# Patient Record
Sex: Female | Born: 1960
Health system: Southern US, Community
[De-identification: ages and names within clinical notes are randomized; demographics above are authoritative.]

## PROBLEM LIST (undated history)

## (undated) DIAGNOSIS — F329 Major depressive disorder, single episode, unspecified: Secondary | ICD-10-CM

## (undated) DIAGNOSIS — F32A Depression, unspecified: Secondary | ICD-10-CM

## (undated) DIAGNOSIS — R519 Headache, unspecified: Secondary | ICD-10-CM

## (undated) DIAGNOSIS — M549 Dorsalgia, unspecified: Secondary | ICD-10-CM

## (undated) DIAGNOSIS — E785 Hyperlipidemia, unspecified: Secondary | ICD-10-CM

## (undated) DIAGNOSIS — R51 Headache: Secondary | ICD-10-CM

## (undated) DIAGNOSIS — M542 Cervicalgia: Secondary | ICD-10-CM

## (undated) DIAGNOSIS — B019 Varicella without complication: Secondary | ICD-10-CM

## (undated) HISTORY — DX: Varicella without complication: B01.9

## (undated) HISTORY — DX: Major depressive disorder, single episode, unspecified: F32.9

## (undated) HISTORY — DX: Hyperlipidemia, unspecified: E78.5

## (undated) HISTORY — DX: Cervicalgia: M54.2

## (undated) HISTORY — DX: Headache, unspecified: R51.9

## (undated) HISTORY — DX: Depression, unspecified: F32.A

## (undated) HISTORY — PX: TONSILLECTOMY: SUR1361

## (undated) HISTORY — DX: Dorsalgia, unspecified: M54.9

## (undated) HISTORY — PX: TUBAL LIGATION: SHX77

## (undated) HISTORY — DX: Headache: R51

---

## 2009-12-17 HISTORY — PX: COLONOSCOPY: SHX174

## 2014-05-03 ENCOUNTER — Encounter: Payer: Self-pay | Admitting: Internal Medicine

## 2014-05-03 ENCOUNTER — Ambulatory Visit (INDEPENDENT_AMBULATORY_CARE_PROVIDER_SITE_OTHER)
Admission: RE | Admit: 2014-05-03 | Discharge: 2014-05-03 | Disposition: A | Discharge status: Home / Self Care | Payer: BLUE CROSS/BLUE SHIELD | Source: Ambulatory Visit | Attending: Internal Medicine | Admitting: Internal Medicine

## 2014-05-03 ENCOUNTER — Ambulatory Visit (INDEPENDENT_AMBULATORY_CARE_PROVIDER_SITE_OTHER): Payer: BLUE CROSS/BLUE SHIELD | Admitting: Internal Medicine

## 2014-05-03 ENCOUNTER — Encounter (INDEPENDENT_AMBULATORY_CARE_PROVIDER_SITE_OTHER): Payer: Self-pay

## 2014-05-03 VITALS — BP 116/84 | HR 60 | Temp 98.3°F | Ht 61.0 in | Wt 131.0 lb

## 2014-05-03 DIAGNOSIS — F329 Major depressive disorder, single episode, unspecified: Secondary | ICD-10-CM | POA: Insufficient documentation

## 2014-05-03 DIAGNOSIS — M545 Low back pain, unspecified: Secondary | ICD-10-CM

## 2014-05-03 DIAGNOSIS — F32A Depression, unspecified: Secondary | ICD-10-CM

## 2014-05-03 DIAGNOSIS — R51 Headache: Secondary | ICD-10-CM

## 2014-05-03 DIAGNOSIS — N941 Dyspareunia: Secondary | ICD-10-CM

## 2014-05-03 DIAGNOSIS — R519 Headache, unspecified: Secondary | ICD-10-CM

## 2014-05-03 DIAGNOSIS — IMO0002 Reserved for concepts with insufficient information to code with codable children: Secondary | ICD-10-CM | POA: Insufficient documentation

## 2014-05-03 DIAGNOSIS — M546 Pain in thoracic spine: Secondary | ICD-10-CM | POA: Diagnosis not present

## 2014-05-03 NOTE — Progress Notes (Signed)
Pre visit review using our clinic review tool, if applicable. No additional management support is needed unless otherwise documented below in the visit note. 

## 2014-05-03 NOTE — Assessment & Plan Note (Signed)
She is holding her Osphena right now as she is currently sexually active Advised her she can restart this at any time

## 2014-05-03 NOTE — Progress Notes (Signed)
HPI  Pt presents to the clinic today to establish care and for management of the conditions listed below. She moved from [place] 10/2013. She is transferring care from Dr. Garrison Columbus at [facility].  Depression: This is a chronic issue for her. She been on prozac for 20+ years. She has tried and failed multiple other medications in the past including wellbutrin, lexapro and cympbalta. She is not suicidal but she does think life would be better if she "did not exist". She has seen a therapist in the past but did not feel that it was very helpful. She has Ativan if she ever needs it but reports she has never taken it.  Frequent Headaches: She has chronic headaches that have lead to migraines in the past. She has not had any in the last 3 months. She has a RX for relpax for the treatment of migraines if she needs it.  She also c/o mid and low back pain. The mid back pain started 3 months ago. It is intermittent. The low back pain she has had for a number of years. She reports the pain feels achy and stiff. It does not radiate. She denies numbness and tingling or problems with her bowel or bladder. The pain is worse with certain movements. She denies injury to the area. She takes Aleve as needed for pain.  Dyspareunia: She was on Osphena but reports she stopped it recently because she is not currently sexually active.  Flu: 2014 Tetanus: unsure but knows it was before 2008 Mammogram: 2011 Pap Smear: 05/2013 Colon Screening: 2011 (10 years) Vision Screening: as needed Dentist: as needed  Past Medical History  Diagnosis Date  . Chicken pox   . Depression   . Frequent headaches     Current Outpatient Prescriptions  Medication Sig Dispense Refill  . eletriptan (RELPAX) 40 MG tablet Take 40 mg by mouth as needed for migraine or headache. May repeat in 2 hours if headache persists or recurs.    Marland Kitchen FLUoxetine (PROZAC) 40 MG capsule Take 1 capsule by mouth daily.    Marland Kitchen FOLIC ACID-VIT  U1-LKG B12 PO Take 1 packet by mouth daily.    Marland Kitchen LORazepam (ATIVAN) 0.5 MG tablet Take 1 tablet by mouth at bedtime as needed.    . Ospemifene 60 MG TABS Take 1 tablet by mouth daily. For vaginal dryness     No current facility-administered medications for this visit.    No Known Allergies  Family History  Problem Relation Age of Onset  . Arthritis Mother   . Cancer Mother     Lung  . Alzheimer's disease Mother   . Cancer Father     prostate  . Hypertension Father   . Diabetes Father   . Hypertension Sister   . Diabetes Sister   . Arthritis Maternal Aunt   . Arthritis Maternal Grandmother   . Bipolar disorder Sister     History   Social History  . Marital Status: Married    Spouse Name: N/A  . Number of Children: N/A  . Years of Education: N/A   Occupational History  . Not on file.   Social History Main Topics  . Smoking status: Never Smoker   . Smokeless tobacco: Never Used  . Alcohol Use: 0.0 oz/week    0 Standard drinks or equivalent per week     Comment: occasional  . Drug Use: Not on file  . Sexual Activity: Not on file   Other Topics Concern  .  Not on file   Social History Narrative  . No narrative on file    ROS:  Constitutional: Denies fever, malaise, fatigue, headache or abrupt weight changes.  Respiratory: Denies difficulty breathing, shortness of breath, cough or sputum production.   Cardiovascular: Denies chest pain, chest tightness, palpitations or swelling in the hands or feet.  Musculoskeletal: Pt reports thoracic and lumbar back pain. Denies difficulty with gait, muscle pain or joint pain and swelling.  Skin: Denies redness, rashes, lesions or ulcercations.  Neurological: Denies dizziness, difficulty with memory, difficulty with speech or problems with balance and coordination.  Psych: Pt reports depression. Denies anxiety, SI/HI.  No other specific complaints in a complete review of systems (except as listed in HPI  above).  PE:  There were no vitals taken for this visit. Wt Readings from Last 3 Encounters:  No data found for Wt    General: Appears her stated age, well developed, well nourished in NAD. Cardiovascular: Normal rate and rhythm. S1,S2 noted.  No murmur, rubs or gallops noted.  Pulmonary/Chest: Normal effort and positive vesicular breath sounds. No respiratory distress. No wheezes, rales or ronchi noted.  Musculoskeletal: Decreased flexion of spine but normal extension and rotation. Pinpoint tenderness over thoracic and lumbar spine. No pain with palpation of the paraspinal muscles. Strength 5/5 BUE/BLE.  No difficulty with gait.  Neurological: Alert and oriented. Psychiatric: Mood and affect flat. Behavior is normal. Judgment and thought content normal.    Assessment and Plan:  Thoracic/Lumbar back pain:  Continue Aleve as needed Will obtain xray of thoracic and lumbar spine today  Advised her to make an appt for her annual exam

## 2014-05-03 NOTE — Patient Instructions (Signed)

## 2014-05-03 NOTE — Assessment & Plan Note (Signed)
Chronic but stable on prozac She is not interested in referral to psychotherapy at this time Support offered today

## 2014-05-03 NOTE — Assessment & Plan Note (Signed)
Chronic but stable No issues in the last 3 months She has relpax if needed Will monitor for now

## 2014-06-12 ENCOUNTER — Ambulatory Visit (INDEPENDENT_AMBULATORY_CARE_PROVIDER_SITE_OTHER): Payer: BLUE CROSS/BLUE SHIELD | Admitting: Internal Medicine

## 2014-06-12 ENCOUNTER — Ambulatory Visit: Payer: BLUE CROSS/BLUE SHIELD | Admitting: Internal Medicine

## 2014-06-12 ENCOUNTER — Encounter: Payer: Self-pay | Admitting: Internal Medicine

## 2014-06-12 VITALS — BP 120/82 | HR 60 | Temp 98.1°F | Ht 61.0 in | Wt 134.0 lb

## 2014-06-12 DIAGNOSIS — Z0000 Encounter for general adult medical examination without abnormal findings: Secondary | ICD-10-CM | POA: Diagnosis not present

## 2014-06-12 DIAGNOSIS — F458 Other somatoform disorders: Secondary | ICD-10-CM

## 2014-06-12 DIAGNOSIS — M545 Low back pain, unspecified: Secondary | ICD-10-CM

## 2014-06-12 DIAGNOSIS — M546 Pain in thoracic spine: Secondary | ICD-10-CM

## 2014-06-12 DIAGNOSIS — Z1239 Encounter for other screening for malignant neoplasm of breast: Secondary | ICD-10-CM

## 2014-06-12 DIAGNOSIS — R0989 Other specified symptoms and signs involving the circulatory and respiratory systems: Secondary | ICD-10-CM

## 2014-06-12 DIAGNOSIS — Z13820 Encounter for screening for osteoporosis: Secondary | ICD-10-CM

## 2014-06-12 LAB — VITAMIN D 25 HYDROXY (VIT D DEFICIENCY, FRACTURES): VITD: 16.43 ng/mL — ABNORMAL LOW (ref 30.00–100.00)

## 2014-06-12 LAB — CBC
HCT: 38.2 % (ref 36.0–46.0)
HEMOGLOBIN: 13 g/dL (ref 12.0–15.0)
MCHC: 33.9 g/dL (ref 30.0–36.0)
MCV: 87.4 fl (ref 78.0–100.0)
PLATELETS: 345 10*3/uL (ref 150.0–400.0)
RBC: 4.37 Mil/uL (ref 3.87–5.11)
RDW: 13.3 % (ref 11.5–15.5)
WBC: 5.8 10*3/uL (ref 4.0–10.5)

## 2014-06-12 LAB — COMPREHENSIVE METABOLIC PANEL
ALBUMIN: 4.2 g/dL (ref 3.5–5.2)
ALT: 35 U/L (ref 0–35)
AST: 24 U/L (ref 0–37)
Alkaline Phosphatase: 80 U/L (ref 39–117)
BILIRUBIN TOTAL: 0.3 mg/dL (ref 0.2–1.2)
BUN: 14 mg/dL (ref 6–23)
CALCIUM: 9.4 mg/dL (ref 8.4–10.5)
CO2: 30 mEq/L (ref 19–32)
Chloride: 102 mEq/L (ref 96–112)
Creatinine, Ser: 0.69 mg/dL (ref 0.40–1.20)
GFR: 93.78 mL/min (ref >60.00)
Glucose, Bld: 95 mg/dL (ref 70–99)
Potassium: 4.7 mEq/L (ref 3.5–5.1)
SODIUM: 136 meq/L (ref 135–145)
TOTAL PROTEIN: 7.1 g/dL (ref 6.0–8.3)

## 2014-06-12 LAB — LIPID PANEL
CHOL/HDL RATIO: 4
Cholesterol: 199 mg/dL (ref 0–200)
HDL: 48.2 mg/dL (ref >39.00)
LDL Cholesterol: 124 mg/dL — ABNORMAL HIGH (ref 0–99)
NONHDL: 150.8
TRIGLYCERIDES: 135 mg/dL (ref 0.0–149.0)
VLDL: 27 mg/dL (ref 0.0–40.0)

## 2014-06-12 LAB — TSH: TSH: 2.38 u[IU]/mL (ref 0.35–4.50)

## 2014-06-12 NOTE — Addendum Note (Signed)
Addended by: Marchia Bond on: 06/12/2014 03:54 PM   Modules accepted: Miquel Dunn

## 2014-06-12 NOTE — Addendum Note (Signed)
Addended by: Jearld Fenton on: 06/12/2014 12:59 PM   Modules accepted: Level of Service, SmartSet

## 2014-06-12 NOTE — Patient Instructions (Signed)

## 2014-06-12 NOTE — Progress Notes (Addendum)
Subjective:    Patient ID: Angelica Knight, female    DOB: 1960/02/25, 54 y.o.   MRN: [id number]  HPI  Pt presents to the clinic today for her annual exam.  Flu: 2014 Tetanus: She is unsure but knows it was before 2008 Pap Smear: 05/2013 Mammogram: 2011 Colon Screening: 2011 Vision Screening: as needed Dentist: annually  She is also concerned about a sensation that something is stuck in her throat.This has been going for 2 months now. It is constant. She feels like she has trouble swallowing but food does not get stuck in her throat. She denies post nasal drip, cough or shortness of breath. She has not really tried anything OTC to make it better. She denies reflux symptoms. She has been seen for the same symptoms about 20 years ago. She did have an upper endoscopy which was normal. Many years ago, they did advise her to start taking a PPI to see if her symptoms improved, but she reports she never took it because she did not feel like her symptoms were consistent with reflux. She would like further evaluation today.  She also wants to follow up her mid/lower back pain. She had an xray of the thoracic spine which showed: FINDINGS: There is conventional anatomy with 12 rib-bearing thoracic type vertebral bodies. There is a mild convex right scoliosis centered at T10. The lateral alignment is normal. The thoracic disc spaces are largely preserved. There is mild disc space loss and uncinate spurring at C6-7. No acute osseous findings or paraspinal abnormalities.  Xray of the lumbar spine showed: FINDINGS: There are 5 lumbar type vertebral bodies demonstrating a mild convex left scoliosis centered at L2. The lateral alignment is normal aside from minimal anterolisthesis at L4-5. The disc spaces are preserved. There is moderate facet disease inferiorly. No evidence of acute fracture or pars defect. Bilateral pelvic calcifications are likely Phleboliths.  She reports this back pain  interferes with her ADL's. She is not able to exercise like she would like. She has difficulty with housework, etc. She does take Aleve prn which does seem to provide some relief but she does not take it daily. She would like to know if further evaluation is needed by an orthopedist.  Review of Systems      Past Medical History  Diagnosis Date  . Chicken pox   . Depression   . Frequent headaches     Current Outpatient Prescriptions  Medication Sig Dispense Refill  . eletriptan (RELPAX) 40 MG tablet Take 40 mg by mouth as needed for migraine or headache. May repeat in 2 hours if headache persists or recurs.    Marland Kitchen FLUoxetine (PROZAC) 40 MG capsule Take 1 capsule by mouth daily.    Marland Kitchen FOLIC ACID-VIT O0-BTD H74 PO Take 1 packet by mouth daily.    Marland Kitchen LORazepam (ATIVAN) 0.5 MG tablet Take 1 tablet by mouth at bedtime as needed.    . Ospemifene 60 MG TABS Take 1 tablet by mouth daily. For vaginal dryness     No current facility-administered medications for this visit.    No Known Allergies  Family History  Problem Relation Age of Onset  . Arthritis Mother   . Cancer Mother     Lung  . Alzheimer's disease Mother   . Cancer Father     prostate  . Hypertension Father   . Diabetes Father   . Hypertension Sister   . Diabetes Sister   . Arthritis Maternal Aunt   .  Arthritis Maternal Grandmother   . Bipolar disorder Sister     History   Social History  . Marital Status: Married    Spouse Name: N/A  . Number of Children: N/A  . Years of Education: N/A   Occupational History  . Not on file.   Social History Main Topics  . Smoking status: Never Smoker   . Smokeless tobacco: Never Used  . Alcohol Use: 0.0 oz/week    0 Standard drinks or equivalent per week     Comment: occasional  . Drug Use: No  . Sexual Activity: Yes   Other Topics Concern  . Not on file   Social History Narrative     Constitutional: Pt reports fatigue. Denies fever, malaise, headache or abrupt  weight changes.  HEENT: Pt reports globus sensation. Denies eye pain, eye redness, ear pain, ringing in the ears, wax buildup, runny nose, nasal congestion, bloody nose, or sore throat. Respiratory: Denies difficulty breathing, shortness of breath, cough or sputum production.   Cardiovascular: Denies chest pain, chest tightness, palpitations or swelling in the hands or feet.  Gastrointestinal: Denies abdominal pain, bloating, constipation, diarrhea or blood in the stool.  GU: Denies urgency, frequency, pain with urination, burning sensation, blood in urine, odor or discharge. Musculoskeletal: Pt reports back pain. Denies decrease in range of motion, difficulty with gait, muscle pain or joint pain and swelling.  Skin: Denies redness, rashes, lesions or ulcercations.  Neurological: Denies dizziness, difficulty with memory, difficulty with speech or problems with balance and coordination.  Psych: Denies anxiety, depression, SI/HI.  No other specific complaints in a complete review of systems (except as listed in HPI above).  Objective:   Physical Exam   BP 120/82 mmHg  Pulse 60  Temp(Src) 98.1 F (36.7 C) (Oral)  Ht 5\' 1"  (1.549 m)  Wt 134 lb (60.782 kg)  BMI 25.33 kg/m2  SpO2 98% Wt Readings from Last 3 Encounters:  06/12/14 134 lb (60.782 kg)  05/03/14 131 lb (59.421 kg)    General: Appears her stated age, well developed, well nourished in NAD. Skin: Warm, dry and intact. No rashes, lesions or ulcerations noted. HEENT: Head: normal shape and size; Eyes: sclera white, no icterus, conjunctiva pink, PERRLA and EOMs intact; Ears: Tm's gray and intact, normal light reflex; Throat/Mouth: Teeth present, mucosa pink and moist, no exudate, lesions or ulcerations noted.  Neck: Neck supple, trachea midline. No masses, lumps or thyromegaly present.  Cardiovascular: Normal rate and rhythm. S1,S2 noted.  No murmur, rubs or gallops noted. No JVD or BLE edema. No carotid bruits  noted. Pulmonary/Chest: Normal effort and positive vesicular breath sounds. No respiratory distress. No wheezes, rales or ronchi noted.  Abdomen: Soft and nontender. Normal bowel sounds, no bruits noted. No distention or masses noted. Liver, spleen and kidneys non palpable. Musculoskeletal: Normal flexion, extension and rotation of the spine. She does have some pinpoint tenderness over the lumbar spine. Strength 5/5 BUE/BLE. No difficulty with gait.  Neurological: Alert and oriented. Cranial nerves II-XII grossly intact. Coordination normal.  Psychiatric: Mood anxious but affect normal. Behavior is normal. Judgment and thought content normal.        Assessment & Plan:   Preventative Health Maintenance:  She will consider getting a flu shot in the fall Tetanus not yet due Will order her mammogram for [place], she will call to schedule Pap smear due 05/2018 Encouraged her to make an appt for an eye exam Continue to see a dentist yearly Will check CBC, CMET,  Lipid and Vit D levels today  Thoracic/lumbar back pain:  Advised her to try strengthening her core  Start taking Aleve daily If symptoms persist, will refer to ortho  Globus sensation:  Will check TSH Advised her that this could be GERD, but she still does not want to try PPI Will refer to GI for further evaluation  RTC in 6 months to follow up chronic conditions, sooner if needed Will order bone density for [facility], someone will call you to set this up

## 2014-06-12 NOTE — Progress Notes (Signed)
Pre visit review using our clinic review tool, if applicable. No additional management support is needed unless otherwise documented below in the visit note. 

## 2014-06-13 MED ORDER — VITAMIN D (ERGOCALCIFEROL) 1.25 MG (50000 UNIT) PO CAPS: 50000.0000 [IU] | ORAL_CAPSULE | ORAL | Status: DC

## 2014-06-13 NOTE — Addendum Note (Signed)
Addended by: Lurlean Nanny on: 06/13/2014 02:41 PM   Modules accepted: Orders

## 2014-06-25 ENCOUNTER — Telehealth: Payer: Self-pay | Admitting: *Deleted

## 2014-06-25 NOTE — Telephone Encounter (Signed)
Mammogram is scheduled 07/02/14.

## 2014-06-27 ENCOUNTER — Encounter: Payer: BLUE CROSS/BLUE SHIELD | Admitting: Internal Medicine

## 2014-06-27 ENCOUNTER — Ambulatory Visit: Payer: BLUE CROSS/BLUE SHIELD | Admitting: Internal Medicine

## 2014-07-02 ENCOUNTER — Ambulatory Visit
Admission: RE | Admit: 2014-07-02 | Discharge: 2014-07-02 | Disposition: A | Discharge status: Home / Self Care | Payer: BLUE CROSS/BLUE SHIELD | Source: Ambulatory Visit | Attending: Internal Medicine | Admitting: Internal Medicine

## 2014-07-02 DIAGNOSIS — Z801 Family history of malignant neoplasm of trachea, bronchus and lung: Secondary | ICD-10-CM | POA: Insufficient documentation

## 2014-07-02 DIAGNOSIS — Z13820 Encounter for screening for osteoporosis: Secondary | ICD-10-CM | POA: Diagnosis present

## 2014-07-02 DIAGNOSIS — M8580 Other specified disorders of bone density and structure, unspecified site: Secondary | ICD-10-CM | POA: Insufficient documentation

## 2014-07-02 DIAGNOSIS — Z1239 Encounter for other screening for malignant neoplasm of breast: Secondary | ICD-10-CM

## 2014-07-02 DIAGNOSIS — Z8042 Family history of malignant neoplasm of prostate: Secondary | ICD-10-CM | POA: Insufficient documentation

## 2014-07-02 DIAGNOSIS — Z1231 Encounter for screening mammogram for malignant neoplasm of breast: Secondary | ICD-10-CM | POA: Insufficient documentation

## 2014-08-03 ENCOUNTER — Other Ambulatory Visit: Payer: Self-pay

## 2014-08-03 MED ORDER — FLUOXETINE HCL 40 MG PO CAPS: 40.0000 mg | ORAL_CAPSULE | Freq: Every day | ORAL | Status: DC

## 2014-08-03 NOTE — Telephone Encounter (Signed)
Sent electronically 

## 2014-08-03 NOTE — Telephone Encounter (Signed)
This has not been filled by you-- please advise if okay to refill

## 2014-08-06 MED ORDER — FLUOXETINE HCL 40 MG PO CAPS: 40.0000 mg | ORAL_CAPSULE | Freq: Every day | ORAL | Status: DC

## 2014-08-06 NOTE — Telephone Encounter (Signed)
Pt checking status of refill fluoxetine to [facility]; was sent on 08/03/14 to [place]. Spoke with sylvia at [employer] and she d/c order. rx sent electronically to [facility] as requested. Pt voiced understanding.

## 2014-08-06 NOTE — Addendum Note (Signed)
Addended by: Helene Shoe on: 08/06/2014 02:58 PM   Modules accepted: Orders

## 2014-10-16 ENCOUNTER — Telehealth: Payer: Self-pay | Admitting: Internal Medicine

## 2014-10-16 ENCOUNTER — Other Ambulatory Visit: Payer: Self-pay | Admitting: Internal Medicine

## 2014-10-16 DIAGNOSIS — M545 Low back pain, unspecified: Secondary | ICD-10-CM

## 2014-10-16 DIAGNOSIS — M546 Pain in thoracic spine: Secondary | ICD-10-CM

## 2014-10-16 NOTE — Telephone Encounter (Signed)
Pt called back she would like to get a referral for her back and neck pain She has been going to chiropractor this is not helping

## 2014-10-16 NOTE — Telephone Encounter (Signed)
Referral to ortho placed.

## 2015-04-12 ENCOUNTER — Other Ambulatory Visit: Payer: Self-pay

## 2015-04-12 MED ORDER — FLUOXETINE HCL 40 MG PO CAPS: 40.0000 mg | ORAL_CAPSULE | Freq: Every day | ORAL | Status: DC

## 2015-05-28 ENCOUNTER — Other Ambulatory Visit: Payer: Self-pay | Admitting: Internal Medicine

## 2015-07-04 DIAGNOSIS — N760 Acute vaginitis: Secondary | ICD-10-CM | POA: Diagnosis not present

## 2015-07-11 ENCOUNTER — Ambulatory Visit (INDEPENDENT_AMBULATORY_CARE_PROVIDER_SITE_OTHER): Payer: BLUE CROSS/BLUE SHIELD | Admitting: Family Medicine

## 2015-07-11 ENCOUNTER — Encounter: Payer: Self-pay | Admitting: Family Medicine

## 2015-07-11 VITALS — BP 131/60 | HR 72 | Temp 98.2°F | Ht 61.0 in | Wt 136.9 lb

## 2015-07-11 DIAGNOSIS — R519 Headache, unspecified: Secondary | ICD-10-CM

## 2015-07-11 DIAGNOSIS — R51 Headache: Secondary | ICD-10-CM

## 2015-07-11 DIAGNOSIS — R059 Cough, unspecified: Secondary | ICD-10-CM

## 2015-07-11 DIAGNOSIS — J3089 Other allergic rhinitis: Secondary | ICD-10-CM

## 2015-07-11 DIAGNOSIS — R05 Cough: Secondary | ICD-10-CM

## 2015-07-11 DIAGNOSIS — J019 Acute sinusitis, unspecified: Secondary | ICD-10-CM

## 2015-07-11 DIAGNOSIS — F32A Depression, unspecified: Secondary | ICD-10-CM

## 2015-07-11 DIAGNOSIS — Z23 Encounter for immunization: Secondary | ICD-10-CM

## 2015-07-11 DIAGNOSIS — F329 Major depressive disorder, single episode, unspecified: Secondary | ICD-10-CM

## 2015-07-11 MED ORDER — HYDROCOD POLST-CPM POLST ER 10-8 MG/5ML PO SUER: 5.0000 mL | Freq: Two times a day (BID) | ORAL | Status: DC | PRN

## 2015-07-11 NOTE — Progress Notes (Signed)
Angelica Knight, D.O. Primary care at [facility]  Subjective:    CC: New pt, here to establish care.   HPI: Angelica Knight is a pleasant 55 y.o. female who presents to [place] at [facility] today to establish.    Moved from [place] 2 yrs ago.  Married.   Ortho Doc Basil Dess, MD) - seen recently in Fall '16 for neck pain- OA in neck gave her meloxicam- stopped takin git. Alleve knocks it out.  Hasn't been bothering her lately.   H/O migraines: None for long time. Relpax- only prn migraines but never had to use it yet.    Mood: prozac since 1994, tried several others and this has worked the best.  Felt depressed after her third child.  Deals with depression.    Sister who is bi-polar.    Lorazepam- prn for a n increase in acute stress levels.   Never uses it.   CC: one mo ago severe ST- went to an UC, prednisone and lidocaine gargle; got W, then went to ENT doc- given her ABX, got better, then 2 wks later- worse again. ABout one week sx, no b/l facial pain, bad PND ande constatnt clearing of throat keeping her up at night, HA, No F/C, b/l ear pain. No sob.  + dry cough  Getting two teeth pulled next Wednesday-   PCP- Prescott Gum at [facility].   Past Medical History  Diagnosis Date  . Chicken pox   . Depression   . Frequent headaches   . Back pain   . Neck pain     Past Surgical History  Procedure Laterality Date  . Cesarean section    . Tubal ligation    . Tonsillectomy      Family History  Problem Relation Age of Onset  . Arthritis Mother   . Cancer Mother     Lung  . Alzheimer's disease Mother   . Cancer Father     prostate  . Hypertension Father   . Diabetes Father   . Hypertension Sister   . Diabetes Sister   . Arthritis Maternal Aunt   . Arthritis Maternal Grandmother   . Bipolar disorder Sister     History  Drug Use No  ,  History  Alcohol Use  . 0.0 oz/week  . 0 Standard drinks or equivalent per week     Comment: occasional  ,  History  Smoking status  . Never Smoker   Smokeless tobacco  . Never Used  ,  History  Sexual Activity  . Sexual Activity: Not Currently    Patient's Medications  New Prescriptions   No medications on file  Previous Medications   ELETRIPTAN (RELPAX) 40 MG TABLET    Take 40 mg by mouth as needed for migraine or headache. May repeat in 2 hours if headache persists or recurs.   FLUCONAZOLE (DIFLUCAN) 150 MG TABLET    Take 150 mg by mouth every other day.   FLUOXETINE (PROZAC) 40 MG CAPSULE    Take 1 capsule (40 mg total) by mouth daily. MUST SCHEDULE ANNUAL PHYSICAL FOR June 2017   METRONIDAZOLE (METROGEL) 0.75 % VAGINAL GEL    Place 1 Applicatorful vaginally at bedtime.  Modified Medications   No medications on file  Discontinued Medications   FOLIC ACID-VIT Q000111Q [id number] PO    Take 1 packet by mouth daily.   LORAZEPAM (ATIVAN) 0.5 MG TABLET    Take  1 tablet by mouth at bedtime as needed.   OSPEMIFENE 60 MG TABS    Take 1 tablet by mouth daily. Reported on 07/11/2015   VITAMIN D, ERGOCALCIFEROL, (DRISDOL) 50000 UNITS CAPS CAPSULE    Take 1 capsule (50,000 Units total) by mouth every 7 (seven) days.    ALLERGIES: Review of patient's allergies indicates no known allergies.  Review of Systems:   ( Completed via her Adult Medical History Intake form today ) General:   Denies fever, chills, appetite changes, unexplained weight loss.  Optho/Auditory:   Denies visual changes, blurred vision/LOV, ringing in ears/ diff hearing Respiratory:   Denies SOB, DOE, cough, wheezing.  Cardiovascular:   Denies chest pain, palpitations, new onset peripheral edema  Gastrointestinal:   Denies nausea, vomiting, diarrhea.  Genitourinary:    Denies dysuria, increased frequency, flank pain.  Endocrine:     Denies hot or cold intolerance, polyuria, polydipsia. Musculoskeletal:  Denies unexplained myalgias, joint swelling, arthralgias, gait problems.  Skin:  Denies rash,  suspicious lesions or new/ changes in moles Neurological:    Denies dizziness, syncope, unexplained weakness, lightheadedness, numbness  Psychiatric/Behavioral:   Denies mood changes, suicidal or homicidal ideations, hallucinations    Objective:   Blood pressure 131/60, pulse 72, temperature 98.2 F (36.8 C), temperature source Other (Comment), height 5\' 1"  (1.549 m), weight 136 lb 14.4 oz (62.097 kg), SpO2 98 %. Body mass index is 25.88 kg/(m^2).  General: Well Developed, well nourished, and in no acute distress.  Neuro: Alert and oriented x3, extra-ocular muscles intact, sensation grossly intact.  HEENT: Normocephalic, atraumatic, pupils equal round reactive to light, conjunct clr, TM's clr b/l, NO LAD, OP- erythema mild, neck supple, no gross masses, no carotid bruits, no JVD apprec Skin: no gross suspicious lesions or rashes  Cardiac: Regular rate and rhythm, no murmurs rubs or gallops.  Respiratory: Essentially clear to auscultation bilaterally. Not using accessory muscles, speaking in full sentences.  Abdominal: Soft, not grossly distended Musculoskeletal: Ambulates w/o diff, FROM * 4 ext.  Vasc: less 2 sec cap RF, warm and pink  Psych:  No HI/SI, judgement and insight good.    Impression and Recommendations:    Do NS rinses with distilled water twice daily and as needed. Use OTC flonase as needed If dev any F/C and one sided face pain- call for advice; also f/up with your dentist.   PND causing cough;  Only use meds prn.  R/B d/c pt   Acute sinusitis treated with antibiotics in the past 60 days D/c pt that likely viral vs allergic and since recent ABX no help, try preventative strategies of saline rinses BID and after exposures.   Use Allegra, flonase daily etc, R/B meds d/c pt.  Red flag sx d/c pt  Depression Stable, no need to change txmnt plan  Frequent headaches Stable, no sx currently, use relpax prn only, that she already has from previous PCP   The patient was  counselled, risk factors were discussed, anticipatory guidance given.  Gross side effects, risk and benefits, and alternatives of medications discussed with patient.  Patient is aware that all medications have potential side effects and we are unable to predict every sideeffect or drug-drug interaction that may occur.  Expresses verbal understanding and consents to current therapy plan and treatment regiment.  Note: This document was prepared using Dragon voice recognition software and may include unintentional dictation errors.

## 2015-07-11 NOTE — Patient Instructions (Addendum)
Do NS rinses with distilled water twice daily and as needed. Use OTC flonase as needed If dev any F/C and one sided face pain- call for advice; also f/up with your dentist.        Sinus Rinse WHAT IS A SINUS RINSE? A sinus rinse is a simple home treatment that is used to rinse your sinuses with a sterile mixture of salt and water (saline solution). Sinuses are air-filled spaces in your skull behind the bones of your face and forehead that open into your nasal cavity. You will use the following:  Saline solution.  Neti pot or spray bottle. This releases the saline solution into your nose and through your sinuses. Neti pots and spray bottles can be purchased at [employer], a health food store, or online. WHEN WOULD I DO A SINUS RINSE? A sinus rinse can help to clear mucus, dirt, dust, or pollen from the nasal cavity. You may do a sinus rinse when you have a cold, a virus, nasal allergy symptoms, a sinus infection, or stuffiness in the nose or sinuses. If you are considering a sinus rinse:  Ask your child's health care provider before performing a sinus rinse on your child.  Do not do a sinus rinse if you have had ear or nasal surgery, ear infection, or blocked ears. HOW DO I DO A SINUS RINSE?  Wash your hands.  Disinfect your device according to the directions provided and then dry it.  Use the solution that comes with your device or one that is sold separately in stores. Follow the mixing directions on the package.  Fill your device with the amount of saline solution as directed by the device instructions.  Stand over a sink and tilt your head sideways over the sink.  Place the spout of the device in your upper nostril (the one closer to the ceiling).  Gently pour or squeeze the saline solution into the nasal cavity. The liquid should drain to the lower nostril if you are not overly congested.  Gently blow your nose. Blowing too hard may cause ear pain.  Repeat in  the other nostril.  Clean and rinse your device with clean water and then air-dry it. ARE THERE RISKS OF A SINUS RINSE?  Sinus rinse is generally very safe and effective. However, there are a few risks, which include:   A burning sensation in the sinuses. This may happen if you do not make the saline solution as directed. Make sure to follow all directions when making the saline solution.  Infection from contaminated water. This is rare, but possible.  Nasal irritation.   This information is not intended to replace advice given to you by your health care provider. Make sure you discuss any questions you have with your health care provider.   Document Released: 08/02/2013 Document Reviewed: 08/02/2013 Elsevier Interactive Patient Education [employer].

## 2015-07-14 NOTE — Assessment & Plan Note (Signed)
Stable, no sx currently, use relpax prn only, that she already has from previous PCP

## 2015-07-14 NOTE — Assessment & Plan Note (Signed)
D/c pt that likely viral vs allergic and since recent ABX no help, try preventative strategies of saline rinses BID and after exposures.   Use Allegra, flonase daily etc, R/B meds d/c pt.  Red flag sx d/c pt

## 2015-07-14 NOTE — Assessment & Plan Note (Signed)
Stable, no need to change txmnt plan

## 2015-08-08 ENCOUNTER — Other Ambulatory Visit: Payer: BLUE CROSS/BLUE SHIELD

## 2015-08-15 ENCOUNTER — Other Ambulatory Visit (INDEPENDENT_AMBULATORY_CARE_PROVIDER_SITE_OTHER): Payer: BLUE CROSS/BLUE SHIELD

## 2015-08-15 ENCOUNTER — Ambulatory Visit: Payer: BLUE CROSS/BLUE SHIELD | Admitting: Family Medicine

## 2015-08-15 DIAGNOSIS — Z131 Encounter for screening for diabetes mellitus: Secondary | ICD-10-CM

## 2015-08-15 DIAGNOSIS — Z13220 Encounter for screening for lipoid disorders: Secondary | ICD-10-CM | POA: Diagnosis not present

## 2015-08-15 DIAGNOSIS — F32A Depression, unspecified: Secondary | ICD-10-CM

## 2015-08-15 DIAGNOSIS — Z1329 Encounter for screening for other suspected endocrine disorder: Secondary | ICD-10-CM | POA: Diagnosis not present

## 2015-08-15 DIAGNOSIS — F329 Major depressive disorder, single episode, unspecified: Secondary | ICD-10-CM | POA: Diagnosis not present

## 2015-08-15 LAB — POCT GLYCOSYLATED HEMOGLOBIN (HGB A1C): HEMOGLOBIN A1C: 5.3

## 2015-08-16 LAB — COMPREHENSIVE METABOLIC PANEL
ALBUMIN: 3.9 g/dL (ref 3.6–5.1)
ALT: 18 U/L (ref 6–29)
AST: 19 U/L (ref 10–35)
Alkaline Phosphatase: 63 U/L (ref 33–130)
BUN: 15 mg/dL (ref 7–25)
CHLORIDE: 105 mmol/L (ref 98–110)
CO2: 28 mmol/L (ref 20–31)
Calcium: 9.1 mg/dL (ref 8.6–10.4)
Creat: 0.68 mg/dL (ref 0.50–1.05)
Glucose, Bld: 93 mg/dL (ref 65–99)
Potassium: 5.1 mmol/L (ref 3.5–5.3)
Sodium: 140 mmol/L (ref 135–146)
TOTAL PROTEIN: 6.4 g/dL (ref 6.1–8.1)
Total Bilirubin: 0.5 mg/dL (ref 0.2–1.2)

## 2015-08-16 LAB — CBC WITH DIFFERENTIAL/PLATELET
BASOS ABS: 0 {cells}/uL (ref 0–200)
Basophils Relative: 0 %
EOS PCT: 8 %
Eosinophils Absolute: 400 cells/uL (ref 15–500)
HCT: 38.6 % (ref 35.0–45.0)
HEMOGLOBIN: 12.5 g/dL (ref 11.7–15.5)
Lymphocytes Relative: 35 %
Lymphs Abs: 1750 cells/uL (ref 850–3900)
MCH: 29 pg (ref 27.0–33.0)
MCHC: 32.4 g/dL (ref 32.0–36.0)
MCV: 89.6 fL (ref 80.0–100.0)
MPV: 9.6 fL (ref 7.5–12.5)
Monocytes Absolute: 450 cells/uL (ref 200–950)
Monocytes Relative: 9 %
NEUTROS PCT: 48 %
Neutro Abs: 2400 cells/uL (ref 1500–7800)
Platelets: 322 10*3/uL (ref 140–400)
RBC: 4.31 MIL/uL (ref 3.80–5.10)
RDW: 13.9 % (ref 11.0–15.0)
WBC: 5 10*3/uL (ref 3.8–10.8)

## 2015-08-16 LAB — LIPID PANEL
CHOLESTEROL: 219 mg/dL — AB (ref 125–200)
HDL: 52 mg/dL (ref >=46)
LDL Cholesterol: 135 mg/dL — ABNORMAL HIGH (ref <130)
TRIGLYCERIDES: 160 mg/dL — AB (ref <150)
Total CHOL/HDL Ratio: 4.2 Ratio (ref <=5.0)
VLDL: 32 mg/dL — AB (ref <30)

## 2015-08-16 LAB — TSH: TSH: 2.15 mIU/L

## 2015-08-16 LAB — VITAMIN D 25 HYDROXY (VIT D DEFICIENCY, FRACTURES): VIT D 25 HYDROXY: 27 ng/mL — AB (ref 30–100)

## 2015-08-23 ENCOUNTER — Ambulatory Visit: Payer: BLUE CROSS/BLUE SHIELD | Admitting: Family Medicine

## 2015-08-27 ENCOUNTER — Ambulatory Visit: Payer: BLUE CROSS/BLUE SHIELD | Admitting: Family Medicine

## 2015-08-29 ENCOUNTER — Ambulatory Visit (INDEPENDENT_AMBULATORY_CARE_PROVIDER_SITE_OTHER): Payer: BLUE CROSS/BLUE SHIELD | Admitting: Family Medicine

## 2015-08-29 ENCOUNTER — Encounter: Payer: Self-pay | Admitting: Family Medicine

## 2015-08-29 VITALS — BP 139/76 | HR 78 | Wt 140.0 lb

## 2015-08-29 DIAGNOSIS — F4323 Adjustment disorder with mixed anxiety and depressed mood: Secondary | ICD-10-CM | POA: Diagnosis not present

## 2015-08-29 DIAGNOSIS — F32A Depression, unspecified: Secondary | ICD-10-CM

## 2015-08-29 DIAGNOSIS — I4510 Unspecified right bundle-branch block: Secondary | ICD-10-CM | POA: Diagnosis not present

## 2015-08-29 DIAGNOSIS — F329 Major depressive disorder, single episode, unspecified: Secondary | ICD-10-CM

## 2015-08-29 DIAGNOSIS — R079 Chest pain, unspecified: Secondary | ICD-10-CM | POA: Diagnosis not present

## 2015-08-29 DIAGNOSIS — E782 Mixed hyperlipidemia: Secondary | ICD-10-CM | POA: Diagnosis not present

## 2015-08-29 MED ORDER — CALCIUM CARBONATE-VITAMIN D 600-400 MG-UNIT PO TABS: ORAL_TABLET | ORAL | 11 refills | Status: AC

## 2015-08-29 NOTE — Assessment & Plan Note (Signed)
Patient appears stressed in the office today.   We discussed her need for moderate intensity physical activity of at least 30 minutes daily and other stress management techniques to include but not limited to yoga, meditation, praying etc.

## 2015-08-29 NOTE — Progress Notes (Signed)
Impression and Recommendations:    1. Nonspecific chest pain   2. Right bundle branch block   3. Adjustment disorder with mixed anxiety and depressed mood   4. Mixed hyperlipidemia   5. Depression     Right bundle branch block Borderline right bundle branch block on EKG in the office today and I do not have any prior EKGs for comparison.    In lieu of recent onset of nonspecific chest pain, we will refer to cardiology.   Mixed hyperlipidemia  However her 10 year atherosclerotic cardiovascular risk is only 3%.   Depression Patient states is well controlled. She denies depression or increased lability and declines increasing dose of her fluoxetine.   Adjustment disorder with mixed anxiety and depressed mood Patient appears stressed in the office today.   We discussed her need for moderate intensity physical activity of at least 30 minutes daily and other stress management techniques to include but not limited to yoga, meditation, praying etc.    Nonspecific chest pain Patient prefers to be seen by the cardiologist and not just have an outpatient exercise stress test   Patient's Medications  New Prescriptions   CALCIUM CARBONATE-VITAMIN D 600-400 MG-UNIT TABLET    Take at least 2000 vitamin D per day and at least 1200 of calcium per day  Previous Medications   ELETRIPTAN (RELPAX) 40 MG TABLET    Take 40 mg by mouth as needed for migraine or headache. May repeat in 2 hours if headache persists or recurs.   FLUOXETINE (PROZAC) 40 MG CAPSULE    Take 1 capsule (40 mg total) by mouth daily. MUST SCHEDULE ANNUAL PHYSICAL FOR June 2017  Modified Medications   No medications on file  Discontinued Medications   CHLORPHENIRAMINE-HYDROCODONE (TUSSIONEX) 10-8 MG/5ML SUER    Take 5 mLs by mouth every 12 (twelve) hours as needed for cough (cough, will cause drowsiness.).   FLUCONAZOLE (DIFLUCAN) 150 MG TABLET    Take 150 mg by mouth every other day.   METRONIDAZOLE (METROGEL)  0.75 % VAGINAL GEL    Place 1 Applicatorful vaginally at bedtime.    Return in about 4 weeks (around 09/26/2015) for Follow-up of current medical issues.  The patient was counseled, risk factors were discussed, anticipatory guidance given.  Gross side effects, risk and benefits, and alternatives of medications discussed with patient.  Patient is aware that all medications have potential side effects and we are unable to predict every side effect or drug-drug interaction that may occur.  Expresses verbal understanding and consents to current therapy plan and treatment regimen.  Please see AVS handed out to patient at the end of our visit for further patient instructions/ counseling done pertaining to today's office visit.    Note: This document was prepared using Dragon voice recognition software and may include unintentional dictation errors.   --------------------------------------------------------------------------------------------------------------------------------------------------------------------------------------------------------------------------------------------    Subjective:    CC:  Chief Complaint  Patient presents with  . Chest Pain    Angelica Knight for the last month. Denies shortness of breath, nausea or vomiting. She does have episodes of dizziness but she is not sure it is related.   . Follow-up    HPI: Angelica Knight is a 55 y.o. female who presents to [place] [facility] today for Follow-up to discuss her recent blood work and also she has additional issues as discussed below.   All blood work reviewed in full. Everything within normal limits except  for vitamin D was low and her bad cholesterol levels were elevated. However her 10 year atherosclerotic cardiovascular risk is only 3%.   Been feeling tightness in chest for about one mo or so, sleepy more than usual.   Most noticed when relaxing or lying in bed.   Feels  like something lying on chest.  Not activity related- doesn't notice it when active.  No SOB, more sweating than usual with activity lately.   Feels it today in her upper chest bilaterally.   No heart palpitations. No assoc N/V.   NO difficulty swallowing/ but globus/ throat sensation- but has had for 5-7 yrs now.    Husband's cousins wife just passed of heart attack at age 16.   Has had interpersonal relationships strains in family right now since Dad passed in March.  Sis with bi-polar.   Wt Readings from Last 3 Encounters:  08/29/15 140 lb (63.5 kg)  07/11/15 136 lb 14.4 oz (62.1 kg)  06/12/14 134 lb (60.8 kg)   BP Readings from Last 3 Encounters:  08/29/15 139/76  07/11/15 131/60  06/12/14 120/82   Pulse Readings from Last 3 Encounters:  08/29/15 78  07/11/15 72  06/12/14 60   Recent Results (from the past 2160 hour(s))  VITAMIN D 25 Hydroxy (Vit-D Deficiency, Fractures)     Status: Abnormal   Collection Time: 08/15/15  9:21 AM  Result Value Ref Range   Vit D, 25-Hydroxy 27 (L) 30 - 100 ng/mL    Comment: Vitamin D Status           25-OH Vitamin D        Deficiency                <20 ng/mL        Insufficiency         20 - 29 ng/mL        Optimal             > or = 30 ng/mL   For 25-OH Vitamin D testing on patients on D2-supplementation and patients for whom quantitation of D2 and D3 fractions is required, the QuestAssureD 25-OH VIT D, (D2,D3), LC/MS/MS is recommended: order code [phone] (patients > 2 yrs).   CBC w/Diff     Status: None   Collection Time: 08/15/15  9:21 AM  Result Value Ref Range   WBC 5.0 3.8 - 10.8 K/uL   RBC 4.31 3.80 - 5.10 MIL/uL   Hemoglobin 12.5 11.7 - 15.5 g/dL   HCT 38.6 35.0 - 45.0 %   MCV 89.6 80.0 - 100.0 fL   MCH 29.0 27.0 - 33.0 pg   MCHC 32.4 32.0 - 36.0 g/dL   RDW 13.9 11.0 - 15.0 %   Platelets 322 140 - 400 K/uL   MPV 9.6 7.5 - 12.5 fL   Neutro Abs 2,400 1,500 - 7,800 cells/uL   Lymphs Abs 1,750 850 - 3,900 cells/uL   Monocytes  Absolute 450 200 - 950 cells/uL   Eosinophils Absolute 400 15 - 500 cells/uL   Basophils Absolute 0 0 - 200 cells/uL   Neutrophils Relative % 48 %   Lymphocytes Relative 35 %   Monocytes Relative 9 %   Eosinophils Relative 8 %   Basophils Relative 0 %   Smear Review Criteria for review not met     Comment: ** Please note change in unit of measure and reference range(s). **  Comp Met (CMET)     Status: None  Collection Time: 08/15/15  9:21 AM  Result Value Ref Range   Sodium 140 135 - 146 mmol/L   Potassium 5.1 3.5 - 5.3 mmol/L   Chloride 105 98 - 110 mmol/L   CO2 28 20 - 31 mmol/L   Glucose, Bld 93 65 - 99 mg/dL   BUN 15 7 - 25 mg/dL   Creat 0.68 0.50 - 1.05 mg/dL    Comment:   For patients > or = 56 years of age: The upper reference limit for Creatinine is approximately 13% higher for people identified as African-American.      Total Bilirubin 0.5 0.2 - 1.2 mg/dL   Alkaline Phosphatase 63 33 - 130 U/L   AST 19 10 - 35 U/L   ALT 18 6 - 29 U/L   Total Protein 6.4 6.1 - 8.1 g/dL   Albumin 3.9 3.6 - 5.1 g/dL   Calcium 9.1 8.6 - 10.4 mg/dL  Lipid panel     Status: Abnormal   Collection Time: 08/15/15  9:21 AM  Result Value Ref Range   Cholesterol 219 (H) 125 - 200 mg/dL   Triglycerides 160 (H) <150 mg/dL   HDL 52 >=46 mg/dL   Total CHOL/HDL Ratio 4.2 <=5.0 Ratio   VLDL 32 (H) <30 mg/dL   LDL Cholesterol 135 (H) <130 mg/dL    Comment:   Total Cholesterol/HDL Ratio:CHD Risk                        Coronary Heart Disease Risk Table                                        Men       Women          1/2 Average Risk              3.4        3.3              Average Risk              5.0        4.4           2X Average Risk              9.6        7.1           3X Average Risk             23.4       11.0 Use the calculated Patient Ratio above and the CHD Risk table  to determine the patient's CHD Risk.   TSH     Status: None   Collection Time: 08/15/15  9:21 AM  Result Value  Ref Range   TSH 2.15 mIU/L    Comment:   Reference Range   > or = 20 Years  0.40-4.50   Pregnancy Range First trimester  0.26-2.66 Second trimester 0.55-2.73 Third trimester  0.43-2.91     POCT glycosylated hemoglobin (Hb A1C)     Status: Normal   Collection Time: 08/15/15 10:35 AM  Result Value Ref Range   Hemoglobin A1C 5.3     Patient Active Problem List   Diagnosis Date Noted  . Right bundle branch block 08/29/2015  . Nonspecific chest pain 08/29/2015  . Adjustment disorder with mixed anxiety and depressed mood 08/29/2015  .  Mixed hyperlipidemia 08/29/2015  . Dyspareunia 05/03/2014  . Frequent headaches 05/03/2014  . Depression 05/03/2014  And feels  Past Medical history, Surgical history, Family history, Social history, Allergies and Medications have been entered into the medical record, reviewed and changed as needed.   Allergies:  No Known Allergies  Review of Systems: No fever/ chills, night sweats, no unintended weight loss, No chest pain, or increased shortness of breath. No N/V/D.  Pertinent positives and negatives noted in HPI above    Objective:   Blood Knight 139/76, pulse 78, weight 140 lb (63.5 kg), SpO2 98 %. Body mass index is 26.45 kg/m.  General: Well Developed, well nourished, appropriate for stated age.  Neuro: Alert and oriented x3, extra-ocular muscles intact, sensation grossly intact.  HEENT: Normocephalic, atraumatic, neck supple   Skin: Warm and dry, no gross rash. Cardiac: RRR, S1 S2,  no murmurs rubs or gallops.  Respiratory: ECTA B/L, Not using accessory muscles, speaking in full sentences-unlabored. Vascular:  No gross lower ext edema, cap RF less 2 sec. Psych: No SI/HI, Insight and judgement good

## 2015-08-29 NOTE — Assessment & Plan Note (Addendum)
Borderline right bundle branch block on EKG in the office today and I do not have any prior EKGs for comparison.    In lieu of recent onset of nonspecific chest pain, we will refer to cardiology.

## 2015-08-29 NOTE — Assessment & Plan Note (Signed)
Patient states is well controlled. She denies depression or increased lability and declines increasing dose of her fluoxetine.

## 2015-08-29 NOTE — Assessment & Plan Note (Signed)
Patient prefers to be seen by the cardiologist and not just have an outpatient exercise stress test

## 2015-08-29 NOTE — Assessment & Plan Note (Addendum)
    However her 10 year atherosclerotic cardiovascular risk is only 3%.  We discussed in the office and handouts provided regarding lower saturated and Transfats diet and I recommend she increase aerobic activity levels

## 2015-08-29 NOTE — Patient Instructions (Signed)
Saturated and Transfats are your bad fats and that's what elevates your bad cholesterol levels. Try to avoid these. We'll would increase your good cholesterol or HDL levels is more exercise       Guidelines for a Low Cholesterol, Low Saturated Fat Diet   Fats - Limit total intake of fats and oils. - Avoid butter, stick margarine, shortening, lard, palm and coconut oils. - Limit mayonnaise, salad dressings, gravies and sauces, unless they are homemade with low-fat ingredients. - Limit chocolate. - Choose low-fat and nonfat products, such as low-fat mayonnaise, low-fat or non-hydrogenated peanut butter, low-fat or fat-free salad dressings and nonfat gravy. - Use vegetable oil, such as canola or olive oil. - Look for margarine that does not contain trans fatty acids. - Use nuts in moderate amounts. - Read ingredient labels carefully to determine both amount and type of fat present in foods. Limit saturated and trans fats! - Avoid high-fat processed and convenience foods.  Meats and Meat Alternatives - Choose fish, chicken, [place] and lean meats. - Use dried beans, peas, lentils and tofu. - Limit egg yolks to three to four per week. - If you eat red meat, limit to no more than three servings per week and choose loin or round cuts. - Avoid fatty meats, such as bacon, sausage, franks, luncheon meats and ribs. - Avoid all organ meats, including liver.  Dairy - Choose nonfat or low-fat milk, yogurt and cottage cheese. - Most cheeses are high in fat. Choose cheeses made from non-fat milk, such as mozzarella and ricotta cheese. - Choose light or fat-free cream cheese and sour cream. - Avoid cream and sauces made with cream.  Fruits and Vegetables - Eat a wide variety of fruits and vegetables. - Use lemon juice, vinegar or "mist" olive oil on vegetables. - Avoid adding sauces, fat or oil to vegetables.  Breads, Cereals and Grains - Choose whole-grain breads, cereals, pastas and  rice. - Avoid high-fat snack foods, such as granola, cookies, pies, pastries, doughnuts and croissants.  Cooking Tips - Avoid deep fried foods. - Trim visible fat off meats and remove skin from poultry before cooking. - Bake, broil, boil, poach or roast poultry, fish and lean meats. - Drain and discard fat that drains out of meat as you cook it. - Add little or no fat to foods. - Use vegetable oil sprays to grease pans for cooking or baking. - Steam vegetables. - Use herbs or no-oil marinades to flavor foods.

## 2015-09-10 DIAGNOSIS — E559 Vitamin D deficiency, unspecified: Secondary | ICD-10-CM | POA: Insufficient documentation

## 2015-10-22 ENCOUNTER — Ambulatory Visit (INDEPENDENT_AMBULATORY_CARE_PROVIDER_SITE_OTHER): Payer: BLUE CROSS/BLUE SHIELD | Admitting: Cardiovascular Disease

## 2015-10-22 ENCOUNTER — Encounter: Payer: Self-pay | Admitting: Cardiovascular Disease

## 2015-10-22 VITALS — BP 134/75 | HR 65 | Ht 65.0 in | Wt 139.6 lb

## 2015-10-22 DIAGNOSIS — R0789 Other chest pain: Secondary | ICD-10-CM | POA: Diagnosis not present

## 2015-10-22 DIAGNOSIS — E7800 Pure hypercholesterolemia, unspecified: Secondary | ICD-10-CM | POA: Diagnosis not present

## 2015-10-22 NOTE — Patient Instructions (Signed)
Medication Instructions:  Your physician recommends that you continue on your current medications as directed. Please refer to the Current Medication list given to you today.  Labwork: NONE  Testing/Procedures: Your physician has requested that you have an exercise tolerance test. For further information please visit [email or web address]. Please also follow instruction sheet, as given.  Follow-Up: AS NEEDED   Any Other Special Instructions Will Be Listed Below (If Applicable). TRY TO EXERCISE 30-45 MINUTES 3 TO 4 TIMES A WEEK

## 2015-10-22 NOTE — Progress Notes (Signed)
Cardiology Office Note   Date:  10/22/2015   ID:  Angelica Knight, DOB [birth year], MRN [id number]  PCP:  Mellody Dance, DO  Cardiologist:   Skeet Latch, MD   Chief Complaint  Patient presents with  . New Patient (Initial Visit)    chest tightness; lightheaded/dizziness; occasionally, randomly.       History of Present Illness: Angelica Knight is a 55 y.o. female with hyperlipidemia and right bundle-branch block who presents for an evaluation of chest pain. Angelica Knight saw Dr. Mellody Dance on 08/29/15 chest pain. She was also noted to have a right bundle branch block on EKG.  She was referred to cardiology for further evaluation.  Angelica Knight reports episodes of chest tightening that have been ongoing intermittently for the last 6 months.  It occurs approximately once per week and she describes it as 3-4/10 in severity and does not radiate.  It typically occurs when she is in a recumbant position.  The episodes are associated with shortness of breath and last for approximately 30 minutes.  There is no associated sour taste, burnining, nausea or diaphoresis.  She does not exercise formally, but it does not occur when she is doing housework or yard work.  She has noticed that it is more common when she hasn't slept well.  She also thinks that it may be related to stress.  Her father recently died and her sister has bipolar disorder that is poorly-controlled.   Angelica Knight also reports profound fatigue that occurs sporadically for the last six months.  She has not noted any chest pain, lower extremity edema, orthopnea, or PND.   Past Medical History:  Diagnosis Date  . Back pain   . Chicken pox   . Depression   . Frequent headaches   . Neck pain     Past Surgical History:  Procedure Laterality Date  . CESAREAN SECTION    . TONSILLECTOMY    . TUBAL LIGATION       Current Outpatient Prescriptions  Medication Sig Dispense Refill  . Calcium Carbonate-Vitamin D  600-400 MG-UNIT tablet Take at least 2000 vitamin D per day and at least 1200 of calcium per day 60 tablet 11  . eletriptan (RELPAX) 40 MG tablet Take 40 mg by mouth as needed for migraine or headache. May repeat in 2 hours if headache persists or recurs.    Marland Kitchen FLUoxetine (PROZAC) 40 MG capsule Take 1 capsule (40 mg total) by mouth daily. MUST SCHEDULE ANNUAL PHYSICAL FOR June 2017 90 capsule 0   No current facility-administered medications for this visit.     Allergies:   Review of patient's allergies indicates no known allergies.    Social History:  The patient  reports that she has never smoked. She has never used smokeless tobacco. She reports that she drinks alcohol. She reports that she does not use drugs.   Family History:  The patient's family history includes Alzheimer's disease in her mother; Arthritis in her maternal aunt, mother, and sister; Bipolar disorder in her sister; Cancer in her father and mother; Diabetes in her father and sister; Hypertension in her father and sister.    ROS:  Please see the history of present illness.   Otherwise, review of systems are positive for none.   All other systems are reviewed and negative.    PHYSICAL EXAM: VS:  BP 134/75   Pulse 65   Ht 5\' 5"  (1.651 m)   Wt 139 lb 9.6 oz (  63.3 kg)   BMI 23.23 kg/m  , BMI Body mass index is 23.23 kg/m. GENERAL:  Well appearing HEENT:  Pupils equal round and reactive, fundi not visualized, oral mucosa unremarkable NECK:  No jugular venous distention, waveform within normal limits, carotid upstroke brisk and symmetric, no bruits, no thyromegaly LYMPHATICS:  No cervical adenopathy LUNGS:  Clear to auscultation bilaterally HEART:  RRR.  PMI not displaced or sustained,S1 and S2 within normal limits, no S3, no S4, no clicks, no rubs, no murmurs ABD:  Flat, positive bowel sounds normal in frequency in pitch, no bruits, no rebound, no guarding, no midline pulsatile mass, no hepatomegaly, no splenomegaly EXT:   2 plus pulses throughout, no edema, no cyanosis no clubbing SKIN:  No rashes no nodules NEURO:  Cranial nerves II through XII grossly intact, motor grossly intact throughout PSYCH:  Cognitively intact, oriented to person place and time   EKG:  EKG is ordered today. The ekg ordered today demonstrates sinus rhythm rate 65 bpm.     Recent Labs: 08/15/2015: ALT 18; BUN 15; Creat 0.68; Hemoglobin 12.5; Platelets 322; Potassium 5.1; Sodium 140; TSH 2.15    Lipid Panel    Component Value Date/Time   CHOL 219 (H) 08/15/2015 0921   TRIG 160 (H) 08/15/2015 0921   HDL 52 08/15/2015 0921   CHOLHDL 4.2 08/15/2015 0921   VLDL 32 (H) 08/15/2015 0921   LDLCALC 135 (H) 08/15/2015 0921      Wt Readings from Last 3 Encounters:  10/22/15 139 lb 9.6 oz (63.3 kg)  08/29/15 140 lb (63.5 kg)  07/11/15 136 lb 14.4 oz (62.1 kg)      ASSESSMENT AND PLAN:  # Atypical chest pain: Angelica Knight chest pain is very atypical.  There is no exertional componenet and her only risk factor is hyperlipidemia.  We will obtain an ETT to evaluate for ischemia.  # Hyperlipidemia:  ASCVD 10 year risk is 3%.  However her lipids are elevated.  We discussed the importance of limiting fried and fatty foods, as well as the importance of increasing her exercise to at least 30-40 minutes most days of the week.     Current medicines are reviewed at length with the patient today.  The patient does not have concerns regarding medicines.  The following changes have been made:  no change  Labs/ tests ordered today include:   Orders Placed This Encounter  Procedures  . Exercise Tolerance Test  . EKG 12-Lead     Disposition:   FU with Sharona Rovner C. Oval Linsey, MD, [facility] as needed.     This note was written with the assistance of speech recognition software.  Please excuse any transcriptional errors.  Signed, Kaidyn Hernandes C. Oval Linsey, MD, [facility]  10/22/2015 1:12 PM    [place]

## 2015-10-30 ENCOUNTER — Telehealth (HOSPITAL_COMMUNITY): Payer: Self-pay

## 2015-10-30 NOTE — Telephone Encounter (Signed)
Encounter complete. 

## 2015-10-31 ENCOUNTER — Ambulatory Visit (HOSPITAL_COMMUNITY)
Admission: RE | Admit: 2015-10-31 | Discharge: 2015-10-31 | Disposition: A | Discharge status: Home / Self Care | Payer: BLUE CROSS/BLUE SHIELD | Source: Ambulatory Visit | Attending: Cardiovascular Disease | Admitting: Cardiovascular Disease

## 2015-10-31 DIAGNOSIS — R0789 Other chest pain: Secondary | ICD-10-CM | POA: Diagnosis not present

## 2015-10-31 LAB — EXERCISE TOLERANCE TEST
CSEPED: 11 min
CSEPEDS: 2 s
CSEPEW: 13.4 METS
MPHR: 164 {beats}/min
Peak HR: 169 {beats}/min
Percent HR: 103 %
RPE: 17
Rest HR: 57 {beats}/min

## 2015-11-18 ENCOUNTER — Ambulatory Visit (INDEPENDENT_AMBULATORY_CARE_PROVIDER_SITE_OTHER): Payer: BLUE CROSS/BLUE SHIELD | Admitting: Family Medicine

## 2015-11-18 ENCOUNTER — Encounter: Payer: Self-pay | Admitting: Family Medicine

## 2015-11-18 VITALS — BP 117/74 | HR 58 | Ht 65.0 in | Wt 137.4 lb

## 2015-11-18 DIAGNOSIS — F4323 Adjustment disorder with mixed anxiety and depressed mood: Secondary | ICD-10-CM | POA: Diagnosis not present

## 2015-11-18 DIAGNOSIS — E663 Overweight: Secondary | ICD-10-CM | POA: Diagnosis not present

## 2015-11-18 DIAGNOSIS — D229 Melanocytic nevi, unspecified: Secondary | ICD-10-CM | POA: Diagnosis not present

## 2015-11-18 DIAGNOSIS — Z723 Lack of physical exercise: Secondary | ICD-10-CM

## 2015-11-18 DIAGNOSIS — L821 Other seborrheic keratosis: Secondary | ICD-10-CM | POA: Diagnosis not present

## 2015-11-18 MED ORDER — FLUOXETINE HCL 40 MG PO CAPS: 40.0000 mg | ORAL_CAPSULE | Freq: Every day | ORAL | 3 refills | Status: DC

## 2015-11-18 NOTE — Progress Notes (Signed)
Impression and Recommendations:    1. Adjustment disorder with mixed anxiety and depressed mood   2. Atypical nevi   3. Overweight (BMI 25.0-29.9)   4. Inactivity     Adjustment disorder with mixed anxiety and depressed mood Mood improved from prior.  Sleep-good  Taking Prozac daily.  Trying to walk more for stress management  Atypical nevi See procedure note, sent for pathology  Overweight (BMI 25.0-29.9) Routine counseling done  Weight loss encouraged.  Inactivity Exercise- 150 minutes moderate intensity weekly    Procedure note:  Indication: Atypical Nevi  Medical necessity statement:  On physical examination, irregular skin lesion 21mm R earlobe- upper/ posterior aspect- present with atypical features worrisome for pre-cancerous/ cancerous condition  Consent:  Discussed benefits and risks of procedure and verbal consent obtained  Procedure:  Patient was prepped in sterile fashion for the procedure.  Injection of 0.4cc 1% lido w/o used for anesthesia. In sterile fashion, shave and/or punch biopsy performed.  Hemostasis obtained with use of silver nitrate sticks and pressure to area.  Pt tolerated well. No complications  Blood Loss:  less than 1 cc  Post-Care Instructions:   Proper wound care discussed with patient.  If wound does not continue improve as expected, patient told to notify us immediately    New Prescriptions   No medications on file    Modified Medications   Modified Medication Previous Medication   FLUOXETINE (PROZAC) 40 MG CAPSULE FLUoxetine (PROZAC) 40 MG capsule      Take 1 capsule (40 mg total) by mouth daily. MUST SCHEDULE ANNUAL PHYSICAL FOR June 2017    Take 1 capsule (40 mg total) by mouth daily. MUST SCHEDULE ANNUAL PHYSICAL FOR June 2017    Discontinued Medications   No medications on file    The patient was counseled, risk factors were discussed, anticipatory guidance given.  Return for Follow-up chronic care of med  problems as discussed prior.  Please see AVS handed out to patient at the end of our visit for further patient instructions/ counseling done pertaining to today's office visit.    Note: This document was prepared using Dragon voice recognition software and may include unintentional dictation errors.   --------------------------------------------------------------------------------------------------------------------------------------------------------------------------------------------------------------------------------------------    Subjective:    CC:  Chief Complaint  Patient presents with  . Skin Problem    excision of lesion on right ear    HPI: Angelica Knight is a 55 y.o. female who presents to [place] at [facility] today for issues as discussed below.   R ear lobe 1-2 yrs now- she has felt something.   Seems to be getting bigger and now she can see it in mirror, no bleeding, no itch, no other sx other than bigger and darker.   Tolerating prozac well- works well.         It helps get her out of bed, keeps from being too weepy and sad,     helps motivate her, improves mood and overall sense of well being.       Wt Readings from Last 3 Encounters:  11/18/15 137 lb 6.4 oz (62.3 kg)  10/22/15 139 lb 9.6 oz (63.3 kg)  08/29/15 140 lb (63.5 kg)   BP Readings from Last 3 Encounters:  11/18/15 117/74  10/22/15 134/75  08/29/15 139/76   Pulse Readings from Last 3 Encounters:  11/18/15 (!) 58  10/22/15 65  08/29/15 78   BMI Readings from Last 3 Encounters:  11/18/15  22.86 kg/m  10/22/15 23.23 kg/m  08/29/15 26.45 kg/m     Patient Care Team    Relationship Specialty Notifications Start End  Mellody Dance, DO PCP - General Family Medicine  07/11/15     Patient Active Problem List   Diagnosis Date Noted  . History of migraine headaches 11/24/2015  . Hypertriglyceridemia 11/24/2015  . Atypical nevi 11/24/2015  . Overweight (BMI  25.0-29.9) 11/24/2015  . Inactivity 11/24/2015  . Vitamin D deficiency 09/10/2015  . Right bundle branch block 08/29/2015  . Nonspecific chest pain 08/29/2015  . Adjustment disorder with mixed anxiety and depressed mood 08/29/2015  . Mixed hyperlipidemia 08/29/2015  . Dyspareunia 05/03/2014  . Frequent headaches 05/03/2014  . Depression 05/03/2014    Past Medical history, Surgical history, Family history, Social history, Allergies and Medications have been entered into the medical record, reviewed and changed as needed.   Allergies:  No Known Allergies  Review of Systems  Constitutional: Negative.  Negative for chills, diaphoresis, fever, malaise/fatigue and weight loss.  HENT: Negative.  Negative for congestion, sore throat and tinnitus.   Eyes: Negative.  Negative for blurred vision, double vision and photophobia.  Respiratory: Negative.  Negative for cough and wheezing.   Cardiovascular: Negative.  Negative for chest pain and palpitations.  Gastrointestinal: Negative.  Negative for blood in stool, diarrhea, nausea and vomiting.  Genitourinary: Negative.  Negative for dysuria, frequency and urgency.  Musculoskeletal: Negative.  Negative for joint pain and myalgias.  Skin: Negative for itching and rash.       Pt here for removal of lesion behind right ear  Neurological: Negative.  Negative for dizziness, focal weakness, weakness and headaches.  Endo/Heme/Allergies: Negative.  Negative for environmental allergies and polydipsia. Does not bruise/bleed easily.  Psychiatric/Behavioral: Negative.  Negative for depression and memory loss. The patient is not nervous/anxious and does not have insomnia.      Objective:   Blood pressure 117/74, pulse (!) 58, height 5\' 5"  (1.651 m), weight 137 lb 6.4 oz (62.3 kg). Body mass index is 22.86 kg/m. General: Well Developed, well nourished, appropriate for stated age.  Neuro: Alert and oriented x3, extra-ocular muscles intact, sensation  grossly intact.  HEENT: Normocephalic, atraumatic, neck supple   Skin: Warm and dry, no gross rash. Hyperkeratotic raised lesion 2.37mm R helix ear- near upper 1/3 of helix.   Cardiac: RRR, S1 S2,  no murmurs rubs or gallops.  Respiratory: ECTA B/L, Not using accessory muscles, speaking in full sentences-unlabored. Vascular:  No gross lower ext edema, cap RF less 2 sec. Psych: No HI/SI, judgement and insight good, Euthymic mood. Full Affect.

## 2015-11-24 DIAGNOSIS — Z8669 Personal history of other diseases of the nervous system and sense organs: Secondary | ICD-10-CM | POA: Insufficient documentation

## 2015-11-24 DIAGNOSIS — E781 Pure hyperglyceridemia: Secondary | ICD-10-CM | POA: Insufficient documentation

## 2015-11-24 DIAGNOSIS — D229 Melanocytic nevi, unspecified: Secondary | ICD-10-CM | POA: Insufficient documentation

## 2015-11-24 DIAGNOSIS — E663 Overweight: Secondary | ICD-10-CM | POA: Insufficient documentation

## 2015-11-24 DIAGNOSIS — Z723 Lack of physical exercise: Secondary | ICD-10-CM | POA: Insufficient documentation

## 2015-11-24 NOTE — Assessment & Plan Note (Signed)
Mood improved from prior.  Sleep-good  Taking Prozac daily.  Trying to walk more for stress management

## 2015-11-24 NOTE — Assessment & Plan Note (Signed)
See procedure note, sent for pathology

## 2015-11-24 NOTE — Assessment & Plan Note (Signed)
Exercise- 150 minutes moderate intensity weekly

## 2015-11-24 NOTE — Assessment & Plan Note (Signed)
Routine counseling done  Weight loss encouraged.

## 2015-11-27 ENCOUNTER — Telehealth: Payer: Self-pay | Admitting: Cardiovascular Disease

## 2015-11-27 NOTE — Telephone Encounter (Signed)
New message  Pt returning [place] Pratt's call  Please call back

## 2015-11-27 NOTE — Telephone Encounter (Signed)
Form for [place] Team received from patient Short term mission trip release form to [place] 01/31/16-02/10/16 hot during day, cold at night.  Approved by Dr Oval Linsey Copy left at front desk and sent to be scanned in Epic Patient aware

## 2015-11-27 NOTE — Telephone Encounter (Signed)
Spoke to patient. She thinks call back may be related to a form she needed MD signature for regarding medical fitness for overseas mission trip. She states OK to call her home line [phone]) today and leave msg if form ready for pickup.

## 2016-01-11 DIAGNOSIS — H524 Presbyopia: Secondary | ICD-10-CM | POA: Diagnosis not present

## 2016-03-10 ENCOUNTER — Ambulatory Visit (INDEPENDENT_AMBULATORY_CARE_PROVIDER_SITE_OTHER): Payer: BLUE CROSS/BLUE SHIELD

## 2016-03-10 ENCOUNTER — Ambulatory Visit (INDEPENDENT_AMBULATORY_CARE_PROVIDER_SITE_OTHER): Payer: BLUE CROSS/BLUE SHIELD | Admitting: Orthopaedic Surgery

## 2016-03-10 ENCOUNTER — Encounter (INDEPENDENT_AMBULATORY_CARE_PROVIDER_SITE_OTHER): Payer: Self-pay | Admitting: Orthopaedic Surgery

## 2016-03-10 DIAGNOSIS — M79644 Pain in right finger(s): Secondary | ICD-10-CM

## 2016-03-10 MED ORDER — PREDNISONE 10 MG (21) PO TBPK: ORAL_TABLET | ORAL | 0 refills | Status: DC

## 2016-03-10 NOTE — Progress Notes (Signed)
Office Visit Note   Patient: Angelica Knight           Date of Birth: 1960/09/17           MRN: [id number] Visit Date: 03/10/2016              Requested by: Mellody Dance, DO [address] [place], [place] [address] PCP: Mellody Dance, DO   Assessment & Plan: Visit Diagnoses:  1. Thumb pain, right     Plan: Mild MCP OA flare up.  Thumb spica brace, wean as tolerated.  Sterapred.  F/u prn.  Follow-Up Instructions: Return if symptoms worsen or fail to improve.   Orders:  Orders Placed This Encounter  Procedures  . XR Hand Complete Right   Meds ordered this encounter  Medications  . predniSONE (STERAPRED UNI-PAK 21 TAB) 10 MG (21) TBPK tablet    Sig: Take as directed    Dispense:  21 tablet    Refill:  0      Procedures: No procedures performed   Clinical Data: No additional findings.   Subjective: Chief Complaint  Patient presents with  . Right Thumb - Pain    56 yo female with right thumb MCP pain that radiates into index finger.  Denies injuries.  Pain is worse with pinch and gripping.  Denies numbness.  Has not taken nsaids.     Review of Systems  Constitutional: Negative.   HENT: Negative.   Eyes: Negative.   Respiratory: Negative.   Cardiovascular: Negative.   Endocrine: Negative.   Musculoskeletal: Negative.   Neurological: Negative.   Hematological: Negative.   Psychiatric/Behavioral: Negative.   All other systems reviewed and are negative.    Objective: Vital Signs: There were no vitals taken for this visit.  Physical Exam  Constitutional: She is oriented to person, place, and time. She appears well-developed and well-nourished.  HENT:  Head: Normocephalic and atraumatic.  Eyes: EOM are normal.  Neck: Neck supple.  Pulmonary/Chest: Effort normal.  Abdominal: Soft.  Neurological: She is alert and oriented to person, place, and time.  Skin: Skin is warm. Capillary refill takes less than 2 seconds.  Psychiatric: She has a  normal mood and affect. Her behavior is normal. Judgment and thought content normal.  Nursing note and vitals reviewed.   Ortho Exam Right thumb - no swelling or warmth - normal ROM of IP, MCP joint - neg grind test - neg finkelstein  Specialty Comments:  No specialty comments available.  Imaging: Xr Hand Complete Right  Result Date: 03/10/2016 No acute findings.  Minimal OA of MCP joint.    PMFS History: Patient Active Problem List   Diagnosis Date Noted  . History of migraine headaches 11/24/2015  . Hypertriglyceridemia 11/24/2015  . Atypical nevi 11/24/2015  . Overweight (BMI 25.0-29.9) 11/24/2015  . Inactivity 11/24/2015  . Vitamin D deficiency 09/10/2015  . Right bundle branch block 08/29/2015  . Nonspecific chest pain 08/29/2015  . Adjustment disorder with mixed anxiety and depressed mood 08/29/2015  . Mixed hyperlipidemia 08/29/2015  . Dyspareunia 05/03/2014  . Frequent headaches 05/03/2014  . Depression 05/03/2014   Past Medical History:  Diagnosis Date  . Back pain   . Chicken pox   . Depression   . Frequent headaches   . Hyperlipidemia   . Neck pain     Family History  Problem Relation Age of Onset  . Arthritis Mother   . Cancer Mother     Lung  . Alzheimer's disease Mother   .  Cancer Father     prostate  . Hypertension Father   . Diabetes Father   . Hypertension Sister   . Diabetes Sister   . Arthritis Maternal Aunt   . Bipolar disorder Sister   . Arthritis Sister     Past Surgical History:  Procedure Laterality Date  . CESAREAN SECTION    . TONSILLECTOMY    . TUBAL LIGATION     Social History   Occupational History  . Not on file.   Social History Main Topics  . Smoking status: Never Smoker  . Smokeless tobacco: Never Used  . Alcohol use 0.0 oz/week     Comment: occasional  . Drug use: No  . Sexual activity: Not Currently

## 2016-06-23 ENCOUNTER — Encounter: Payer: Self-pay | Admitting: Family Medicine

## 2016-06-23 ENCOUNTER — Ambulatory Visit (INDEPENDENT_AMBULATORY_CARE_PROVIDER_SITE_OTHER): Payer: BLUE CROSS/BLUE SHIELD | Admitting: Family Medicine

## 2016-06-23 VITALS — BP 125/77 | HR 51 | Ht 65.0 in | Wt 130.0 lb

## 2016-06-23 DIAGNOSIS — E559 Vitamin D deficiency, unspecified: Secondary | ICD-10-CM | POA: Diagnosis not present

## 2016-06-23 DIAGNOSIS — Z7189 Other specified counseling: Secondary | ICD-10-CM | POA: Diagnosis not present

## 2016-06-23 DIAGNOSIS — F4323 Adjustment disorder with mixed anxiety and depressed mood: Secondary | ICD-10-CM | POA: Diagnosis not present

## 2016-06-23 DIAGNOSIS — Z7184 Encounter for health counseling related to travel: Secondary | ICD-10-CM | POA: Insufficient documentation

## 2016-06-23 DIAGNOSIS — Z23 Encounter for immunization: Secondary | ICD-10-CM

## 2016-06-23 DIAGNOSIS — E782 Mixed hyperlipidemia: Secondary | ICD-10-CM

## 2016-06-23 DIAGNOSIS — Z8669 Personal history of other diseases of the nervous system and sense organs: Secondary | ICD-10-CM

## 2016-06-23 MED ORDER — CHLOROQUINE PHOSPHATE 250 MG PO TABS: 500.0000 mg | ORAL_TABLET | ORAL | 0 refills | Status: DC

## 2016-06-23 NOTE — Assessment & Plan Note (Signed)
Pulled up CDC recommendations and discussed with patient while she was in the office with me.  She does not feel that typhoid fever will be an issue, but we discussed restrictions only eating and drinking food that is from the location they are staying at and not from any indigenous areas.

## 2016-06-23 NOTE — Assessment & Plan Note (Signed)
Has been asymptomatic and has not needed to use her Relpax as of yet.

## 2016-06-23 NOTE — Assessment & Plan Note (Signed)
Continue Prozac daily.  Continue with exercise 3-4 days per week.  This is helping patient the most.

## 2016-06-23 NOTE — Assessment & Plan Note (Signed)
Continue supplementation, will recheck 1 year from when last checked

## 2016-06-23 NOTE — Patient Instructions (Addendum)
[email or web address]   You'll need to get your second hep A vaccination in 6 months from when the first was administered.  --> please make a f/up appt with Dr Jenetta Downer for chronic f/up and to get this as well.     - Also 10 months ago is when we obtain your fasting lipid profile.  Since she been doing so well, come in in 2-3 months and we can repeat that and other labs.     Food Poisoning and Traveling Food poisoning is an illness caused by organisms present in something you ate or drank. Some types of food poisoning trigger symptoms quickly. Others may take 1-2 weeks for symptoms to appear. Symptoms of food poisoning include:  Diarrhea.  Cramping.  Fever.  Vomiting.  Dizziness.  Aches and pains.  Before you travel, learn as much as you can about the foodborne illnesses that are common in the areas where you are going. The risk for food poisoning varies from country to country and from one region of the world to another. Countries with a low risk include:  The [place].  [place].  [place].  [place].  [place].  Some countries in [place].  Countries with a mid-range risk include:  Countries in [place].  [place].  Some [place].  Countries with a high risk include:  Countries in [place].  Countries in the [place].  [place].  Countries in [place] and [place].  What types of illness can be passed through food and drinks? Most cases of food poisoning are caused by bacteria or viruses, such as:  E. coli.  Campylobacteriosis.  Shigellosis.  Salmonellosis.  Norovirus.  Rotavirus.  Astrovirus.  Food poisoning can also be caused by some microscopic parasites. These are organisms that live off of another larger organism. Illness caused by parasites can take 1-2 weeks to appear and may last several months. The illnesses  include:  Giardiasis.  Amebiasis.  Cyclosporiasis.  Cryptosporidiosis.  Medicines are available to treat these infections. How can I decrease my risk of food poisoning while traveling? Good hand hygiene always helps protect your health. Carry small bottles of alcohol-based hand sanitizer. Use it to clean your hands before you eat. Also follow these basic guidelines for eating and drinking while traveling. Foods that are generally safe to eat:  Food that is thoroughly cooked.  Food that is served hot.  Hard-boiled eggs.  Fruits and vegetables you wash and peel yourself.  Milk or cheese that is treated with high heat (pasteurized).  Foods to avoid:  Raw or undercooked foods.  Raw or runny eggs.  Food that is not hot (such as food that has been on a buffet or picnic table for a while).  Raw fruits or vegetables that have not been washed and peeled.  Other items made with fresh vegetables or fruits, like salad and salsa.  Milk or cheese that is not pasteurized.  Meat from local animals, such as monkeys and bats.  Drinks that are generally safe:  Bottled waters, soda, or sports drinks.  Drinks you know were sealed until you opened them.  Water you know has been treated, boiled, or filtered to remove microorganisms.  Ice from treated or bottled water.  Drinks made with boiling water, such as tea or coffee.  Pasteurized milk.  Drinks to avoid:  Water from the tap or a well.  Water from a fresh water source, such as a stream.  Ice from a tap, well,  or fresh water source.  Beverages that include water from a well, tap, or fresh water source.  Milk that is not pasteurized.  Beverages from soda fountains.  What should I do if I think I have developed food poisoning while traveling?  If you have been vomiting or have diarrhea, drink water or other fluids to replace what you lost.  Take over-the-counter medicine to stop diarrhea. Consider packing a supply  of antidiarrheal medicine to take with you.  Contact your health care provider if your symptoms do not clear up after a few days. How is food poisoning treated? Most cases of food poisoning go away without treatment within 48 hours. Food poisoning caused by bacteria may be treated with antibiotic medicines.Viruses cannot be treated with antibiotics.Illnesses caused by parasites might respond to antiparasitic medicines. You can also treat symptoms of food poisoning with medicines to:  Prevent or slow diarrhea (antidiarrheals).  Rehydrate your body. Oral rehydration therapy (ORT) helps your body replace fluids and electrolytes lost in diarrhea or vomit.  Treat rashes caused by diarrhea using hydrocortisone cream.  Should I be proactively treated for food poisoning before I travel? Talk to your health care provider about your personal risk for contracting food poisoning while traveling. Discuss the foodborne illnesses that are common in the areas you plan to visit. Make sure you understand how to use any medicines your health care provider recommends. Some medicines can help prevent food poisoning. These include:  Bismuth subsalicylate (BSS). This is an ingredient in over-the-counter antidiarrheal medicines. It might help some adult travelers prevent illness.  Probiotics or "good" bacteria. Taking probiotics might help prevent some illnesses.  Antibiotics. These protect against bacteria only. Taking antibiotics to prevent food poisoning is usually suggested only for people who have a compromised immune system.  How can I learn more?  Visit a travel medicine clinic or speak with a health care provider who specializes in travel medicine as soon as you know your travel plans.  Check the Travelers' Health section on the website of the Centers for Disease Control and Prevention (CDC): WaveHands.com.ee This information is not intended to replace advice given to you by your health care  provider. Make sure you discuss any questions you have with your health care provider. Document Released: 03/28/2002 Document Revised: 06/04/2015 Document Reviewed: 04/14/2013 Elsevier Interactive Patient Education  [employer].      The hepatitis A vaccine is given in 2 doses, 6 months apart. The vaccine is nearly 100% effective and has been a routine childhood vaccine in the [place] since 2005.  See Vaccine Information Statements (VIS) for more information. Eat safe foods: Eat Food that is cooked and served hot Hard-cooked eggs Fruits and vegetables you have washed in clean water or peeled yourself Pasteurized dairy products Don't eat Food served at room temperature Food from street vendors Raw or soft-cooked (runny) eggs Raw or undercooked (rare) meat or fish Unwashed or unpeeled raw fruits and vegetables Peelings from fruit or vegetables Condiments (such as salsa) made with fresh ingredients Salads Unpasteurized dairy products "Bushmeat" (monkeys, bats, or other wild game) Drink safe beverages: Drink Bottled water that is sealed (carbonated is safer) Water that has been disinfected (boiled, filtered, treated) Ice made with bottled or disinfected water Carbonated drinks Hot coffee or tea Pasteurized milk Don't drink Tap or well water Ice made with tap or well water Drinks made with tap or well water (such as reconstituted juice) Flavored ice and popsicles Unpasteurized milk For more information  see Oncologist.  Practice hygiene and cleanliness: Wash your hands often. If soap and water aren't available, clean your hands with hand sanitizer (containing at least 60% alcohol). Don't touch your eyes, nose, or mouth. If you need to touch your face, make sure your hands are clean. Try to avoid close contact, such as kissing, hugging, or sharing eating utensils or cups with people who are sick. If you feel sick and think you may have hepatitis  A: Talk to your doctor or nurse if you feel seriously ill, especially if you have a fever. Tell them about your travel. For more information about medical care abroad, see [place]. See list of International Joint Commission-accredited facilities. Avoid contact with other people while you are sick. Do not prepare or serve food to other people.

## 2016-06-23 NOTE — Progress Notes (Signed)
Pt here for an acute care OV today   Impression and Recommendations:    1. Travel advice encounter   2. Adjustment disorder with mixed anxiety and depressed mood   3. Vitamin D deficiency   4. Mixed hyperlipidemia   5. History of migraine headaches      Mixed hyperlipidemia Fasting lipid profile last checked 10 months ago, repeat 1 year from prior draw.    Patient has been doing fabulously.  She is exercising regularly and has lost 10 pounds since last August.     History of migraine headaches Has been asymptomatic and has not needed to use her Relpax as of yet.  Adjustment disorder with mixed anxiety and depressed mood Continue Prozac daily.  Continue with exercise 3-4 days per week.  This is helping patient the most.  Vitamin D deficiency Continue supplementation, will recheck 1 year from when last checked  Travel advice encounter Pulled up CDC recommendations and discussed with patient while she was in the office with me.  She does not feel that typhoid fever will be an issue, but we discussed restrictions only eating and drinking food that is from the location they are staying at and not from any indigenous areas.     The patient was counseled, risk factors were discussed, anticipatory guidance given.    Meds ordered this encounter  Medications  . chloroquine (ARALEN) 250 MG tablet    Sig: Take 2 tablets (500 mg total) by mouth once a week.    Dispense:  14 tablet    Refill:  0    Orders Placed This Encounter  Procedures  . Hepatitis A vaccine adult IM    Gross side effects, risk and benefits, and alternatives of medications and treatment plan in general discussed with patient.  Patient is aware that all medications have potential side effects and we are unable to predict every side effect or drug-drug interaction that may occur.   Patient will call with any questions prior to using medication if they have concerns.  Expresses verbal understanding and  consents to current therapy and treatment regimen.  No barriers to understanding were identified.  Red flag symptoms and signs discussed in detail.  Patient expressed understanding regarding what to do in case of emergency\urgent symptoms  Please see AVS handed out to patient at the end of our visit for further patient instructions/ counseling done pertaining to today's office visit.   Return in about 6 months (around 12/23/2016). Encouraged pt to f/up sooner for fasting bldwrk if she would like- vit D and FLP     Note: This document was prepared using Dragon voice recognition software and may include unintentional dictation errors.  Mellody Dance 2:21 PM --------------------------------------------------------------------------------------------------------------------------------------------------------------------------------------------------------------------------------------------    Subjective:    CC:  Chief Complaint  Patient presents with  . Medical Clearance    Traveling to [place]    HPI: Angelica Knight is a 56 y.o. female who presents to [place] [facility] today for issues as discussed below.   Traveling to [place] July 12th on [place] trip.   She is going to a H[place] RISK malaria region of [place].       Tetanus last administered on 6\22\17.  UTD  Patient never had a vaccination for Hep A.   We will start that today.  Also recommended she take malaria prophylaxis.    Wt Readings from Last 3 Encounters:  06/23/16 130 lb (59 kg)  11/18/15 137 lb 6.4  oz (62.3 kg)  10/22/15 139 lb 9.6 oz (63.3 kg)   BP Readings from Last 3 Encounters:  06/23/16 125/77  11/18/15 117/74  10/22/15 134/75   BMI Readings from Last 3 Encounters:  06/23/16 21.63 kg/m  11/18/15 22.86 kg/m  10/22/15 23.23 kg/m     Patient Care Team    Relationship Specialty Notifications Start End  Mellody Dance, DO PCP - General Family Medicine  07/11/15       Patient Active Problem List   Diagnosis Date Noted  . Hypertriglyceridemia 11/24/2015    Priority: High  . Adjustment disorder with mixed anxiety and depressed mood 08/29/2015    Priority: High  . Mixed hyperlipidemia 08/29/2015    Priority: High  . History of migraine headaches 11/24/2015    Priority: Medium  . Vitamin D deficiency 09/10/2015    Priority: Medium  . Atypical nevi 11/24/2015    Priority: Low  . Travel advice encounter 06/23/2016  . Right bundle branch block 08/29/2015  . Nonspecific chest pain 08/29/2015  . Dyspareunia 05/03/2014  . Frequent headaches 05/03/2014  . Depression 05/03/2014    Past Medical history, Surgical history, Family history, Social history, Allergies and Medications have been entered into the medical record, reviewed and changed as needed.    Current Meds  Medication Sig  . Calcium Carbonate-Vitamin D 600-400 MG-UNIT tablet Take at least 2000 vitamin D per day and at least 1200 of calcium per day  . FLUoxetine (PROZAC) 40 MG capsule Take 1 capsule (40 mg total) by mouth daily. MUST SCHEDULE ANNUAL PHYSICAL FOR June 2017  . [DISCONTINUED] predniSONE (STERAPRED UNI-PAK 21 TAB) 10 MG (21) TBPK tablet Take as directed    Allergies:  No Known Allergies   Review of Systems: General:   Denies fever, chills, unexplained weight loss.  Optho/Auditory:   Denies visual changes, blurred vision/LOV Respiratory:   Denies wheeze, DOE more than baseline levels.  Cardiovascular:   Denies chest pain, palpitations, new onset peripheral edema  Gastrointestinal:   Denies nausea, vomiting, diarrhea, abd pain.  Genitourinary: Denies dysuria, freq/ urgency, flank pain or discharge from genitals.  Endocrine:     Denies hot or cold intolerance, polyuria, polydipsia. Musculoskeletal:   Denies unexplained myalgias, joint swelling, unexplained arthralgias, gait problems.  Skin:  Denies new onset rash, suspicious lesions Neurological:     Denies dizziness,  unexplained weakness, numbness  Psychiatric/Behavioral:   Denies mood changes, suicidal or homicidal ideations, hallucinations    Objective:   Blood pressure 125/77, pulse (!) 51, height 5\' 5"  (1.651 m), weight 130 lb (59 kg). Body mass index is 21.63 kg/m. General:  Well Developed, well nourished, appropriate for stated age.  Neuro:  Alert and oriented,  extra-ocular muscles intact  HEENT:  Normocephalic, atraumatic, neck supple Skin:  no gross rash, warm, pink. Cardiac:  RRR, S1 S2 Respiratory:  ECTA B/L and A/P, Not using accessory muscles, speaking in full sentences- unlabored. Vascular:  Ext warm, no cyanosis apprec.; cap RF less 2 sec. Psych:  No HI/SI, judgement and insight good, Euthymic mood. Full Affect.

## 2016-06-23 NOTE — Assessment & Plan Note (Addendum)
Fasting lipid profile last checked 10 months ago, repeat 1 year from prior draw.    Patient has been doing fabulously.  She is exercising regularly and has lost 10 pounds since last August.

## 2016-11-06 ENCOUNTER — Ambulatory Visit (HOSPITAL_COMMUNITY)
Admission: EM | Admit: 2016-11-06 | Discharge: 2016-11-06 | Disposition: A | Discharge status: Home / Self Care | Payer: BLUE CROSS/BLUE SHIELD | Attending: Internal Medicine | Admitting: Internal Medicine

## 2016-11-06 ENCOUNTER — Ambulatory Visit (INDEPENDENT_AMBULATORY_CARE_PROVIDER_SITE_OTHER): Payer: BLUE CROSS/BLUE SHIELD

## 2016-11-06 ENCOUNTER — Encounter (HOSPITAL_COMMUNITY): Payer: Self-pay | Admitting: Emergency Medicine

## 2016-11-06 DIAGNOSIS — M79645 Pain in left finger(s): Secondary | ICD-10-CM

## 2016-11-06 DIAGNOSIS — S60413A Abrasion of left middle finger, initial encounter: Secondary | ICD-10-CM

## 2016-11-06 DIAGNOSIS — S6992XA Unspecified injury of left wrist, hand and finger(s), initial encounter: Secondary | ICD-10-CM | POA: Diagnosis not present

## 2016-11-06 MED ORDER — MUPIROCIN 2 % EX OINT: 1.0000 "application " | TOPICAL_OINTMENT | Freq: Two times a day (BID) | CUTANEOUS | 0 refills | Status: DC

## 2016-11-06 NOTE — Discharge Instructions (Signed)
X-ray negative for fracture or dislocation. Daily dressing of wound. Keep clean and dry. You can gently wash with soap and water. You can apply bactroban as directed for the next 3-4 days. Monitor for any worsening of symptoms, spreading redness, increased warmth, increased pain, fever, follow-up for reevaluation.

## 2016-11-06 NOTE — ED Triage Notes (Signed)
Pt reports laceration to left middle finger onset 0830  sts she slammed the shower door against finger  Bleeding controlled  Last tetanus w/in 5 years.   A&O X4... NAD... Ambulatory

## 2016-11-06 NOTE — ED Provider Notes (Signed)
[facility]    CSN: [id number] Arrival date & time: 11/06/16  1028     History   Chief Complaint Chief Complaint  Patient presents with  . Laceration    HPI Angelica Knight is a 56 y.o. female.   56 year old female with history of hyperlipidemia, depression, back pain comes in for a few hour history of laceration to the left middle finger. Patient states she was trying to close a door to her shower, and scraped/slammed the shower door against the finger. Bleeding controlled. Denies numbness, tingling. Able to move finger. Last tetanus injection within the past 5 years. Has not taken anything for the pain.      Past Medical History:  Diagnosis Date  . Back pain   . Chicken pox   . Depression   . Frequent headaches   . Hyperlipidemia   . Neck pain     Patient Active Problem List   Diagnosis Date Noted  . Travel advice encounter 06/23/2016  . History of migraine headaches 11/24/2015  . Hypertriglyceridemia 11/24/2015  . Atypical nevi 11/24/2015  . Vitamin D deficiency 09/10/2015  . Right bundle branch block 08/29/2015  . Nonspecific chest pain 08/29/2015  . Adjustment disorder with mixed anxiety and depressed mood 08/29/2015  . Mixed hyperlipidemia 08/29/2015  . Dyspareunia 05/03/2014  . Frequent headaches 05/03/2014  . Depression 05/03/2014    Past Surgical History:  Procedure Laterality Date  . CESAREAN SECTION    . TONSILLECTOMY    . TUBAL LIGATION      OB History    No data available       Home Medications    Prior to Admission medications   Medication Sig Start Date End Date Taking? Authorizing Provider  FLUoxetine (PROZAC) 40 MG capsule Take 1 capsule (40 mg total) by mouth daily. MUST SCHEDULE ANNUAL PHYSICAL FOR June 2017 11/18/15  Yes Opalski, Neoma Laming, DO  Calcium Carbonate-Vitamin D 600-400 MG-UNIT tablet Take at least 2000 vitamin D per day and at least 1200 of calcium per day 08/29/15   Mellody Dance, DO  chloroquine  (ARALEN) 250 MG tablet Take 2 tablets (500 mg total) by mouth once a week. 06/23/16   Opalski, Neoma Laming, DO  eletriptan (RELPAX) 40 MG tablet Take 40 mg by mouth as needed for migraine or headache. May repeat in 2 hours if headache persists or recurs.    [provider]  mupirocin ointment (BACTROBAN) 2 % Apply 1 application topically 2 (two) times daily. 11/06/16   Ok Edwards, PA-C    Family History Family History  Problem Relation Age of Onset  . Arthritis Mother   . Cancer Mother        Lung  . Alzheimer's disease Mother   . Cancer Father        prostate  . Hypertension Father   . Diabetes Father   . Hypertension Sister   . Diabetes Sister   . Arthritis Maternal Aunt   . Bipolar disorder Sister   . Arthritis Sister     Social History Social History  Substance Use Topics  . Smoking status: Never Smoker  . Smokeless tobacco: Never Used  . Alcohol use 0.0 oz/week     Comment: occasional     Allergies   Patient has no known allergies.   Review of Systems Review of Systems  Reason unable to perform ROS: See HPI as above.     Physical Exam Triage Vital Signs ED Triage Vitals  Enc Vitals Group  BP 11/06/16 1048 (!) 129/48     Pulse Rate 11/06/16 1048 67     Resp 11/06/16 1048 20     Temp 11/06/16 1048 98.2 F (36.8 C)     Temp Source 11/06/16 1048 Oral     SpO2 11/06/16 1048 100 %     Weight --      Height --      Head Circumference --      Peak Flow --      Pain Score 11/06/16 1050 2     Pain Loc --      Pain Edu? --      Excl. in [place]? --    No data found.   Updated Vital Signs BP (!) 129/48 (BP Location: Left Arm)   Pulse 67   Temp 98.2 F (36.8 C) (Oral)   Resp 20   SpO2 100%   Visual Acuity Right Eye Distance:   Left Eye Distance:   Bilateral Distance:    Right Eye Near:   Left Eye Near:    Bilateral Near:     Physical Exam  Constitutional: She is oriented to person, place, and time. She appears well-developed and  well-nourished. No distress.  HENT:  Head: Normocephalic and atraumatic.  Eyes: Pupils are equal, round, and reactive to light. Conjunctivae are normal.  Musculoskeletal:  1 cm abrasion of left middle finger. Point tenderness to palpation of the DIP joint. Full range of motion. Strength testing deferred due to abrasion. Sensation intact. Radial pulses 2+, Cap refill less than 2 seconds.  Neurological: She is alert and oriented to person, place, and time.     UC Treatments / Results  Labs (all labs ordered are listed, but only abnormal results are displayed) Labs Reviewed - No data to display  EKG  EKG Interpretation None       Radiology Dg Finger Middle Left  Result Date: 11/06/2016 CLINICAL DATA:  Smashed LEFT middle finger in the shower door this morning, pain and bruising EXAM: LEFT MIDDLE FINGER 2+V COMPARISON:  None FINDINGS: Osseous mineralization normal. Joint spaces preserved. No acute fracture, dislocation, or bone destruction. IMPRESSION: No acute osseous abnormalities. Electronically Signed   By: Lavonia Dana M.D.   On: 11/06/2016 12:17    Procedures Procedures (including critical care time)  Medications Ordered in UC Medications - No data to display   Initial Impression / Assessment and Plan / UC Course  I have reviewed the triage vital signs and the nursing notes.  Pertinent labs & imaging results that were available during my care of the patient were reviewed by me and considered in my medical decision making (see chart for details).    Skin abrasion without need to repair. X-ray without dislocation or fracture. Wound care instructions given. Return precautions given.  Final Clinical Impressions(s) / UC Diagnoses   Final diagnoses:  Abrasion of left middle finger, initial encounter    New Prescriptions Discharge Medication List as of 11/06/2016 12:29 PM    START taking these medications   Details  mupirocin ointment (BACTROBAN) 2 % Apply 1  application topically 2 (two) times daily., Starting Fri 11/06/2016, Normal         Tasia Catchings, Tama Grosz V, PA-C 11/06/16 2047

## 2017-02-09 ENCOUNTER — Telehealth: Payer: Self-pay | Admitting: Family Medicine

## 2017-02-09 NOTE — Telephone Encounter (Signed)
Patient called to  Request Rx refill for : FLUoxetine (PROZAC) 40 MG capsule [[id number]]  Order Details  Dose: 40 mg Route: Oral Frequency: Daily  Dispense Quantity: 90 capsule Refills: 3 Fills remaining: --        Sig: Take 1 capsule (40 mg total) by mouth daily. MUST SCHEDULE ANNUAL PHYSICAL FOR June 2017     --Patient doesn't know if  OV required for refill please advise.  Pt uses:   [facility], [place] [facility] [address] [phone] (Phone) [phone] (Fax)   --glh

## 2017-02-10 ENCOUNTER — Other Ambulatory Visit: Payer: Self-pay

## 2017-02-10 MED ORDER — FLUOXETINE HCL 40 MG PO CAPS: 40.0000 mg | ORAL_CAPSULE | Freq: Every day | ORAL | 0 refills | Status: DC

## 2017-02-10 NOTE — Telephone Encounter (Signed)
Patient called for refill on Prozac. Reviewed chart patient is due for follow up.  Refill sent in with no refills and patient contacted to make appointment. MPulliam, CMA/RT(R)

## 2017-02-10 NOTE — Telephone Encounter (Signed)
Sent into pharmacy after review of chart. MPulliam, CMA/RT(R)

## 2017-02-12 ENCOUNTER — Telehealth: Payer: Self-pay | Admitting: Family Medicine

## 2017-02-12 NOTE — Telephone Encounter (Signed)
Left patient message to contact office to set up Provider required OV.  --glh

## 2017-04-01 ENCOUNTER — Other Ambulatory Visit: Payer: Self-pay | Admitting: Family Medicine

## 2017-04-01 DIAGNOSIS — Z1231 Encounter for screening mammogram for malignant neoplasm of breast: Secondary | ICD-10-CM

## 2017-04-05 ENCOUNTER — Ambulatory Visit
Admission: RE | Admit: 2017-04-05 | Discharge: 2017-04-05 | Disposition: A | Discharge status: Home / Self Care | Payer: BLUE CROSS/BLUE SHIELD | Source: Ambulatory Visit | Attending: Family Medicine | Admitting: Family Medicine

## 2017-04-05 DIAGNOSIS — R928 Other abnormal and inconclusive findings on diagnostic imaging of breast: Secondary | ICD-10-CM | POA: Diagnosis not present

## 2017-04-05 DIAGNOSIS — Z1231 Encounter for screening mammogram for malignant neoplasm of breast: Secondary | ICD-10-CM

## 2017-04-07 ENCOUNTER — Other Ambulatory Visit: Payer: Self-pay | Admitting: Family Medicine

## 2017-04-07 DIAGNOSIS — R928 Other abnormal and inconclusive findings on diagnostic imaging of breast: Secondary | ICD-10-CM

## 2017-04-07 DIAGNOSIS — N6489 Other specified disorders of breast: Secondary | ICD-10-CM

## 2017-04-14 ENCOUNTER — Ambulatory Visit
Admission: RE | Admit: 2017-04-14 | Discharge: 2017-04-14 | Disposition: A | Discharge status: Home / Self Care | Payer: BLUE CROSS/BLUE SHIELD | Source: Ambulatory Visit | Attending: Family Medicine | Admitting: Family Medicine

## 2017-04-14 DIAGNOSIS — N6489 Other specified disorders of breast: Secondary | ICD-10-CM

## 2017-04-14 DIAGNOSIS — R928 Other abnormal and inconclusive findings on diagnostic imaging of breast: Secondary | ICD-10-CM

## 2017-05-03 ENCOUNTER — Encounter: Payer: Self-pay | Admitting: Family Medicine

## 2017-05-03 ENCOUNTER — Ambulatory Visit (INDEPENDENT_AMBULATORY_CARE_PROVIDER_SITE_OTHER): Payer: BLUE CROSS/BLUE SHIELD | Admitting: Family Medicine

## 2017-05-03 ENCOUNTER — Other Ambulatory Visit (HOSPITAL_COMMUNITY)
Admission: RE | Admit: 2017-05-03 | Discharge: 2017-05-03 | Disposition: A | Discharge status: Home / Self Care | Payer: BLUE CROSS/BLUE SHIELD | Source: Ambulatory Visit | Attending: Family Medicine | Admitting: Family Medicine

## 2017-05-03 VITALS — BP 130/82 | HR 67 | Ht 65.0 in | Wt 138.4 lb

## 2017-05-03 DIAGNOSIS — Z8739 Personal history of other diseases of the musculoskeletal system and connective tissue: Secondary | ICD-10-CM | POA: Diagnosis not present

## 2017-05-03 DIAGNOSIS — E781 Pure hyperglyceridemia: Secondary | ICD-10-CM

## 2017-05-03 DIAGNOSIS — E559 Vitamin D deficiency, unspecified: Secondary | ICD-10-CM

## 2017-05-03 DIAGNOSIS — Z780 Asymptomatic menopausal state: Secondary | ICD-10-CM

## 2017-05-03 DIAGNOSIS — Z0000 Encounter for general adult medical examination without abnormal findings: Secondary | ICD-10-CM | POA: Insufficient documentation

## 2017-05-03 DIAGNOSIS — Z124 Encounter for screening for malignant neoplasm of cervix: Secondary | ICD-10-CM | POA: Insufficient documentation

## 2017-05-03 DIAGNOSIS — F4323 Adjustment disorder with mixed anxiety and depressed mood: Secondary | ICD-10-CM

## 2017-05-03 DIAGNOSIS — Z13820 Encounter for screening for osteoporosis: Secondary | ICD-10-CM | POA: Diagnosis not present

## 2017-05-03 DIAGNOSIS — E782 Mixed hyperlipidemia: Secondary | ICD-10-CM

## 2017-05-03 NOTE — Progress Notes (Signed)
Impression and Recommendations:    1. Encounter for wellness examination   2. Papanicolaou smear for cervical cancer screening   3. Mixed hyperlipidemia   4. Hypertriglyceridemia   5. Adjustment disorder with mixed anxiety and depressed mood   6. Vitamin D deficiency     1.) Anticipatory Guidance: Discussed importance of wearing a seatbelt while driving, not texting while driving; sunscreen when outside along with yearly skin surveillance; eating a well balanced and modest diet; physical activity at least 25 minutes per day or 150 min/ week of moderate to intense activity.  2.) Immunizations / Screenings / Labs:  All immunizations and screenings that patient agrees to, are up-to-date per recommendations or will be updated today.  Patient understands the needs for q 26mo dental and yearly vision screens which pt will schedule independently. Obtain CBC, CMP, HgA1c, Lipid panel, TSH and vit D when fasting if not already done recently.   [place] patient to continue obtaining dilated eye exams regularly.  - Dermatological Screening Advised patient to wear sunscreen and obtain skin exams yearly with dermatology. Watch for spots on her skin that have changed or are changing in any way, grown bigger or darker, etc.  - Bone Health - Patient formerly had a DEXA done in the past due to being Vitamin D deficient. - DEXA scan ordered today.    - Advised prudent use of weight bearing exercise / weight bearing cardio to improve bone health.  - Self Breast Checks - Just went for her last mammogram in March of this year (2019).  - Advised to feel for lumps or bumps or other abnormalities.  Recommended that the patient learn her breasts and learn what feels normal to her, so she can better understand what is abnormal.  She should palpate her breasts every month around the same time.  - Colon Health Patient sent home with stool cards.  Reviewed the use of these with the  patient. Last colonoscopy was 2012 - results were normal, with 10 year follow-up.  3.) Weight:   Discussed goal of losing even 5-10% of current body weight which would improve overall feelings of well being and improve objective health data significantly.   Improve nutrient density of diet through increasing intake of fruits and vegetables and decreasing saturated/trans fats, white flour products and refined sugar products.   Explained to patient what BMI refers to, and what it means medically.    Told patient to think about it as a "medical risk stratification measurement" and how increasing BMI is associated with increasing risk/ or worsening state of various diseases such as hypertension, hyperlipidemia, diabetes, premature OA, depression etc.  American Heart Association guidelines for healthy diet, basically Mediterranean diet, and exercise guidelines of 30 minutes 5 days per week or more discussed in detail.  Health counseling performed.  All questions answered.  - Advised patient to continue exercising to improve health.  Especially advised weight-bearing exercise to improve her bone health.  - Recommended that the patient eventually strive for at least 150 minutes of cardio per week according to guidelines established by the [facility].   - Healthy dietary habits encouraged, including low-carb, and high amounts of lean protein in diet.   - Patient should also consume adequate amounts of water - half of body weight in oz of water per day.  4.) Follow-Up - Full set of fasting lab work drawn today; last lab work was done in 2017.  - Return in 6 months  to review lab work - cholesterol, Vitamin D, etc.   Orders Placed This Encounter  Procedures  . Lipid panel  . VITAMIN D 25 Hydroxy (Vit-D Deficiency, Fractures)  . Comprehensive metabolic panel  . CBC with Differential/Platelet  . TSH  . Hemoglobin A1c    Gross side effects, risk and benefits, and alternatives of medications discussed  with patient.  Patient is aware that all medications have potential side effects and we are unable to predict every side effect or drug-drug interaction that may occur.  Expresses verbal understanding and consents to current therapy plan and treatment regimen.  F-up preventative CPE in 1 year. F/up sooner for chronic care management as discussed and/or prn.  Please see orders placed and AVS handed out to patient at the end of our visit for further patient instructions/ counseling done pertaining to today's office visit.   This document serves as a record of services personally performed by Mellody Dance, DO. It was created on her behalf by Toni Amend, a trained medical scribe. The creation of this record is based on the scribe's personal observations and the provider's statements to them.   I have reviewed the above medical documentation for accuracy and completeness and I concur.  Mellody Dance 05/03/17 6:02 PM    Subjective:    Chief Complaint  Patient presents with  . Annual Exam    HPI: Angelica Knight is a 57 y.o. female who presents to [place] [facility] today a yearly health maintenance exam.  Health Maintenance Summary Reviewed and updated, unless pt declines services.  Colonoscopy:     Last colonoscopy was 2012 - results were normal, with 10 year follow-up. Tobacco History Reviewed:   Y  Alcohol:    No concerns, no excessive use Exercise Habits:   Not meeting AHA guidelines STD concerns:   none Drug Use:   None Birth control method:   n/a Menses regular:     n/a Lumps or breast concerns:  Just went for her last mammogram in March of this year (2019).  Denies any new lumps or bumps.  She does not self-check or palpate her breasts once a month.   Breast Cancer Family History:      No DEXA: Had one done at [facility] in the past due to Vitamin D Deficiency.  Patient is going abroad for a mission trip soon.  Gynecological Health Does not  remember when her last pap smear was performed.  She moved here in 2015, and prior to this practice being established, was visiting [place] in [place], but cannot remember if she had a pap smear done there.  Had a tubal ligation, but still has all of her female organs.  Did have a yeast infection a while back, but no lumps, bumps, or other issues vaginally.  She and her husband no longer have sex since her husband had his prostate removed.  Bone Health Patient formerly had a DEXA done in the recent past due to being Vitamin D deficient.  Visual Health Last eye exam was performed a year ago, around Christmas.  She does obtain eye exams regularly.  Dental Health Is visiting the dentist more than usual recently because she had a root canal last week and is having issues with it.  Dermatological Health Does not see a dermatologist regularly for skin screenings.  She wears sunscreen regularly.  Exercise Habits Exercises about 2-3 times per week.  She was going every day to a gym, taking a  fitness class, but stopped at the end of January.  She's taking a couple of months off, but has been doing elliptical and free weights at home.  Denies issues with unusual bleeding.  No new shortless of breath, exercise intolerance, nausea or vomiting, diarrhea, or issues with GI.    Immunization History  Administered Date(s) Administered  . Hepatitis A, Adult 06/23/2016  . Tdap 07/11/2015    Health Maintenance  Topic Date Due  . INFLUENZA VACCINE  08/19/2017  . MAMMOGRAM  04/06/2019  . COLONOSCOPY  01/20/2020  . PAP SMEAR  05/03/2020  . TETANUS/TDAP  07/10/2025  . Hepatitis C Screening  Completed  . HIV Screening  Completed     Wt Readings from Last 3 Encounters:  05/03/17 138 lb 6.4 oz (62.8 kg)  06/23/16 130 lb (59 kg)  11/18/15 137 lb 6.4 oz (62.3 kg)   BP Readings from Last 3 Encounters:  05/03/17 130/82  11/06/16 (!) 129/48  06/23/16 125/77   Pulse Readings from Last 3  Encounters:  05/03/17 67  11/06/16 67  06/23/16 (!) 51     Past Medical History:  Diagnosis Date  . Back pain   . Chicken pox   . Depression   . Frequent headaches   . Hyperlipidemia   . Neck pain       Past Surgical History:  Procedure Laterality Date  . CESAREAN SECTION    . TONSILLECTOMY    . TUBAL LIGATION        Family History  Problem Relation Age of Onset  . Arthritis Mother   . Cancer Mother        Lung  . Alzheimer's disease Mother   . Cancer Father        prostate  . Hypertension Father   . Diabetes Father   . Hypertension Sister   . Diabetes Sister   . Arthritis Maternal Aunt   . Bipolar disorder Sister   . Arthritis Sister       Social History   Substance and Sexual Activity  Drug Use No  ,   Social History   Substance and Sexual Activity  Alcohol Use Yes  . Alcohol/week: 0.0 oz   Comment: occasional  ,   Social History   Tobacco Use  Smoking Status Never Smoker  Smokeless Tobacco Never Used  ,   Social History   Substance and Sexual Activity  Sexual Activity Not Currently    Current Outpatient Medications on File Prior to Visit  Medication Sig Dispense Refill  . Calcium Carbonate-Vitamin D 600-400 MG-UNIT tablet Take at least 2000 vitamin D per day and at least 1200 of calcium per day 60 tablet 11  . chloroquine (ARALEN) 250 MG tablet Take 2 tablets (500 mg total) by mouth once a week. 14 tablet 0  . FLUoxetine (PROZAC) 40 MG capsule Take 1 capsule (40 mg total) by mouth daily. MUST SCHEDULE OFFICE VISIT FOR FURTHER REFILLS. 90 capsule 0   No current facility-administered medications on file prior to visit.     Allergies: Patient has no known allergies.  Review of Systems: General:   Denies fever, chills, unexplained weight loss.  Optho/Auditory:   Denies visual changes, blurred vision/LOV Respiratory:   Denies SOB, DOE more than baseline levels.  Cardiovascular:   Denies chest pain, palpitations, new onset  peripheral edema  Gastrointestinal:   Denies nausea, vomiting, diarrhea.  Genitourinary: Denies dysuria, freq/ urgency, flank pain or discharge from genitals.  Endocrine:     Denies  hot or cold intolerance, polyuria, polydipsia. Musculoskeletal:   Denies unexplained myalgias, joint swelling, unexplained arthralgias, gait problems.  Skin:  Denies rash, suspicious lesions Neurological:     Denies dizziness, unexplained weakness, numbness  Psychiatric/Behavioral:   Denies mood changes, suicidal or homicidal ideations, hallucinations    Objective:    Blood pressure 130/82, pulse 67, height 5\' 5"  (1.651 m), weight 138 lb 6.4 oz (62.8 kg), SpO2 98 %. Body mass index is 23.03 kg/m. General Appearance:    Alert, cooperative, no distress, appears stated age  Head:    Normocephalic, without obvious abnormality, atraumatic  Eyes:    PERRL, conjunctiva/corneas clear, EOM's intact, fundi    benign, both eyes  Ears:    Normal TM's and external ear canals, both ears  Nose:   Nares normal, septum midline, mucosa normal, no drainage    or sinus tenderness  Throat:   Lips w/o lesion, mucosa moist, and tongue normal; teeth and   gums normal  Neck:   Supple, symmetrical, trachea midline, no adenopathy;    thyroid:  no enlargement/tenderness/nodules; no carotid   bruit or JVD  Back:     Symmetric, no curvature, ROM normal, no CVA tenderness  Lungs:     Clear to auscultation bilaterally, respirations unlabored, no       Wh/ R/ R  Chest Wall:    No tenderness or gross deformity; normal excursion   Heart:    Regular rate and rhythm, S1 and S2 normal, no murmur, rub   or gallop  Breast Exam:    No tenderness, masses, or nipple abnormality b/l; no d/c  Abdomen:     Soft, non-tender, bowel sounds active all four quadrants, NO   G/R/R, no masses, no organomegaly  Genitalia:    Ext genitalia: without lesion, no rash or discharge, No         tenderness;  Cervix: WNL's w/o discharge or lesion;        Adnexa:  No  tenderness or palpable masses   Rectal:    Not performed.  Extremities:   Extremities normal, atraumatic, no cyanosis or gross edema  Pulses:   2+ and symmetric all extremities  Skin:   Age consistent solar keratosis present, non-suspicious appearing.  Warm, dry, Skin color, texture, turgor normal, no obvious rashes or lesions Psych: No HI/SI, judgement and insight good, Euthymic mood. Full Affect.  Neurologic:   CNII-XII intact, normal strength, sensation and reflexes    Throughout

## 2017-05-03 NOTE — Patient Instructions (Signed)
Preventive Care for Adults, Female  A healthy lifestyle and preventive care can promote health and wellness. Preventive health guidelines for women include the following key practices.   A routine yearly physical is a good way to check with your health care provider about your health and preventive screening. It is a chance to share any concerns and updates on your health and to receive a thorough exam.   Visit your dentist for a routine exam and preventive care every 6 months. Brush your teeth twice a day and floss once a day. Good oral hygiene prevents tooth decay and gum disease.   The frequency of eye exams is based on your age, health, family medical history, use of contact lenses, and other factors. Follow your health care provider's recommendations for frequency of eye exams.   Eat a healthy diet. Foods like vegetables, fruits, whole grains, low-fat dairy products, and lean protein foods contain the nutrients you need without too many calories. Decrease your intake of foods high in solid fats, added sugars, and salt. Eat the right amount of calories for you.Get information about a proper diet from your health care provider, if necessary.   Regular physical exercise is one of the most important things you can do for your health. Most adults should get at least 150 minutes of moderate-intensity exercise (any activity that increases your heart rate and causes you to sweat) each week. In addition, most adults need muscle-strengthening exercises on 2 or more days a week.   Maintain a healthy weight. The body mass index (BMI) is a screening tool to identify possible weight problems. It provides an estimate of body fat based on height and weight. Your health care provider can find your BMI, and can help you achieve or maintain a healthy weight.For adults 20 years and older:   - A BMI below 18.5 is considered underweight.   - A BMI of 18.5 to 24.9 is normal.   - A BMI of 25 to 29.9 is  considered overweight.   - A BMI of 30 and above is considered obese.   Maintain normal blood lipids and cholesterol levels by exercising and minimizing your intake of trans and saturated fats.  Eat a balanced diet with plenty of fruit and vegetables. Blood tests for lipids and cholesterol should begin at age 20 and be repeated every 5 years minimum.  If your lipid or cholesterol levels are high, you are over 40, or you are at high risk for heart disease, you may need your cholesterol levels checked more frequently.Ongoing high lipid and cholesterol levels should be treated with medicines if diet and exercise are not working.   If you smoke, find out from your health care provider how to quit. If you do not use tobacco, do not start.   Lung cancer screening is recommended for adults aged 55-80 years who are at high risk for developing lung cancer because of a history of smoking. A yearly low-dose CT scan of the lungs is recommended for people who have at least a 30-pack-year history of smoking and are a current smoker or have quit within the past 15 years. A pack year of smoking is smoking an average of 1 pack of cigarettes a day for 1 year (for example: 1 pack a day for 30 years or 2 packs a day for 15 years). Yearly screening should continue until the smoker has stopped smoking for at least 15 years. Yearly screening should be stopped for people who develop a   health problem that would prevent them from having lung cancer treatment.   If you are pregnant, do not drink alcohol. If you are breastfeeding, be very cautious about drinking alcohol. If you are not pregnant and choose to drink alcohol, do not have more than 1 drink per day. One drink is considered to be 12 ounces (355 mL) of beer, 5 ounces (148 mL) of wine, or 1.5 ounces (44 mL) of liquor.   Avoid use of street drugs. Do not share needles with anyone. Ask for help if you need support or instructions about stopping the use of  drugs.   High blood pressure causes heart disease and increases the risk of stroke. Your blood pressure should be checked at least yearly.  Ongoing high blood pressure should be treated with medicines if weight loss and exercise do not work.   If you are 69-55 years old, ask your health care provider if you should take aspirin to prevent strokes.   Diabetes screening involves taking a blood sample to check your fasting blood sugar level. This should be done once every 3 years, after age 38, if you are within normal weight and without risk factors for diabetes. Testing should be considered at a younger age or be carried out more frequently if you are overweight and have at least 1 risk factor for diabetes.   Breast cancer screening is essential preventive care for women. You should practice "breast self-awareness."  This means understanding the normal appearance and feel of your breasts and may include breast self-examination.  Any changes detected, no matter how small, should be reported to a health care provider.  Women in their 80s and 30s should have a clinical breast exam (CBE) by a health care provider as part of a regular health exam every 1 to 3 years.  After age 66, women should have a CBE every year.  Starting at age 1, women should consider having a mammogram (breast X-ray test) every year.  Women who have a family history of breast cancer should talk to their health care provider about genetic screening.  Women at a high risk of breast cancer should talk to their health care providers about having an MRI and a mammogram every year.   -Breast cancer gene (BRCA)-related cancer risk assessment is recommended for women who have family members with BRCA-related cancers. BRCA-related cancers include breast, ovarian, tubal, and peritoneal cancers. Having family members with these cancers may be associated with an increased risk for harmful changes (mutations) in the breast cancer genes BRCA1 and  BRCA2. Results of the assessment will determine the need for genetic counseling and BRCA1 and BRCA2 testing.   The Pap test is a screening test for cervical cancer. A Pap test can show cell changes on the cervix that might become cervical cancer if left untreated. A Pap test is a procedure in which cells are obtained and examined from the lower end of the uterus (cervix).   - Women should have a Pap test starting at age 57.   - Between ages 90 and 70, Pap tests should be repeated every 2 years.   - Beginning at age 63, you should have a Pap test every 3 years as long as the past 3 Pap tests have been normal.   - Some women have medical problems that increase the chance of getting cervical cancer. Talk to your health care provider about these problems. It is especially important to talk to your health care provider if a  new problem develops soon after your last Pap test. In these cases, your health care provider may recommend more frequent screening and Pap tests.   - The above recommendations are the same for women who have or have not gotten the vaccine for human papillomavirus (HPV).   - If you had a hysterectomy for a problem that was not cancer or a condition that could lead to cancer, then you no longer need Pap tests. Even if you no longer need a Pap test, a regular exam is a good idea to make sure no other problems are starting.   - If you are between ages 36 and 66 years, and you have had normal Pap tests going back 10 years, you no longer need Pap tests. Even if you no longer need a Pap test, a regular exam is a good idea to make sure no other problems are starting.   - If you have had past treatment for cervical cancer or a condition that could lead to cancer, you need Pap tests and screening for cancer for at least 20 years after your treatment.   - If Pap tests have been discontinued, risk factors (such as a new sexual partner) need to be reassessed to determine if screening should  be resumed.   - The HPV test is an additional test that may be used for cervical cancer screening. The HPV test looks for the virus that can cause the cell changes on the cervix. The cells collected during the Pap test can be tested for HPV. The HPV test could be used to screen women aged 70 years and older, and should be used in women of any age who have unclear Pap test results. After the age of 67, women should have HPV testing at the same frequency as a Pap test.   Colorectal cancer can be detected and often prevented. Most routine colorectal cancer screening begins at the age of 57 years and continues through age 26 years. However, your health care provider may recommend screening at an earlier age if you have risk factors for colon cancer. On a yearly basis, your health care provider may provide home test kits to check for hidden blood in the stool.  Use of a small camera at the end of a tube, to directly examine the colon (sigmoidoscopy or colonoscopy), can detect the earliest forms of colorectal cancer. Talk to your health care provider about this at age 23, when routine screening begins. Direct exam of the colon should be repeated every 5 -10 years through age 49 years, unless early forms of pre-cancerous polyps or small growths are found.   People who are at an increased risk for hepatitis B should be screened for this virus. You are considered at high risk for hepatitis B if:  -You were born in a country where hepatitis B occurs often. Talk with your health care provider about which countries are considered high risk.  - Your parents were born in a high-risk country and you have not received a shot to protect against hepatitis B (hepatitis B vaccine).  - You have HIV or AIDS.  - You use needles to inject street drugs.  - You live with, or have sex with, someone who has Hepatitis B.  - You get hemodialysis treatment.  - You take certain medicines for conditions like cancer, organ  transplantation, and autoimmune conditions.   Hepatitis C blood testing is recommended for all people born from [birth year] through 1965 and any individual  with known risks for hepatitis C.   Practice safe sex. Use condoms and avoid high-risk sexual practices to reduce the spread of sexually transmitted infections (STIs). STIs include gonorrhea, chlamydia, syphilis, trichomonas, herpes, HPV, and human immunodeficiency virus (HIV). Herpes, HIV, and HPV are viral illnesses that have no cure. They can result in disability, cancer, and death. Sexually active women aged 25 years and younger should be checked for chlamydia. Older women with new or multiple partners should also be tested for chlamydia. Testing for other STIs is recommended if you are sexually active and at increased risk.   Osteoporosis is a disease in which the bones lose minerals and strength with aging. This can result in serious bone fractures or breaks. The risk of osteoporosis can be identified using a bone density scan. Women ages 65 years and over and women at risk for fractures or osteoporosis should discuss screening with their health care providers. Ask your health care provider whether you should take a calcium supplement or vitamin D to There are also several preventive steps women can take to avoid osteoporosis and resulting fractures or to keep osteoporosis from worsening. -->Recommendations include:  Eat a balanced diet high in fruits, vegetables, calcium, and vitamins.  Get enough calcium. The recommended total intake of is 1,200 mg daily; for best absorption, if taking supplements, divide doses into 250-500 mg doses throughout the day. Of the two types of calcium, calcium carbonate is best absorbed when taken with food but calcium citrate can be taken on an empty stomach.  Get enough vitamin D. NAMS and the National Osteoporosis Foundation recommend at least 1,000 IU per day for women age 50 and over who are at risk of vitamin D  deficiency. Vitamin D deficiency can be caused by inadequate sun exposure (for example, those who live in northern latitudes).  Avoid alcohol and smoking. Heavy alcohol intake (more than 7 drinks per week) increases the risk of falls and hip fracture and women smokers tend to lose bone more rapidly and have lower bone mass than nonsmokers. Stopping smoking is one of the most important changes women can make to improve their health and decrease risk for disease.  Be physically active every day. Weight-bearing exercise (for example, fast walking, hiking, jogging, and weight training) may strengthen bones or slow the rate of bone loss that comes with aging. Balancing and muscle-strengthening exercises can reduce the risk of falling and fracture.  Consider therapeutic medications. Currently, several types of effective drugs are available. Healthcare providers can recommend the type most appropriate for each woman.  Eliminate environmental factors that may contribute to accidents. Falls cause nearly 90% of all osteoporotic fractures, so reducing this risk is an important bone-health strategy. Measures include ample lighting, removing obstructions to walking, using nonskid rugs on floors, and placing mats and/or grab bars in showers.  Be aware of medication side effects. Some common medicines make bones weaker. These include a type of steroid drug called glucocorticoids used for arthritis and asthma, some antiseizure drugs, certain sleeping pills, treatments for endometriosis, and some cancer drugs. An overactive thyroid gland or using too much thyroid hormone for an underactive thyroid can also be a problem. If you are taking these medicines, talk to your doctor about what you can do to help protect your bones.reduce the rate of osteoporosis.    Menopause can be associated with physical symptoms and risks. Hormone replacement therapy is available to decrease symptoms and risks. You should talk to your  health care provider   about whether hormone replacement therapy is right for you.   Use sunscreen. Apply sunscreen liberally and repeatedly throughout the day. You should seek shade when your shadow is shorter than you. Protect yourself by wearing long sleeves, pants, a wide-brimmed hat, and sunglasses year round, whenever you are outdoors.   Once a month, do a whole body skin exam, using a mirror to look at the skin on your back. Tell your health care provider of new moles, moles that have irregular borders, moles that are larger than a pencil eraser, or moles that have changed in shape or color.   -Stay current with required vaccines (immunizations).   Influenza vaccine. All adults should be immunized every year.  Tetanus, diphtheria, and acellular pertussis (Td, Tdap) vaccine. Pregnant women should receive 1 dose of Tdap vaccine during each pregnancy. The dose should be obtained regardless of the length of time since the last dose. Immunization is preferred during the 27th 36th week of gestation. An adult who has not previously received Tdap or who does not know her vaccine status should receive 1 dose of Tdap. This initial dose should be followed by tetanus and diphtheria toxoids (Td) booster doses every 10 years. Adults with an unknown or incomplete history of completing a 3-dose immunization series with Td-containing vaccines should begin or complete a primary immunization series including a Tdap dose. Adults should receive a Td booster every 10 years.  Varicella vaccine. An adult without evidence of immunity to varicella should receive 2 doses or a second dose if she has previously received 1 dose. Pregnant females who do not have evidence of immunity should receive the first dose after pregnancy. This first dose should be obtained before leaving the health care facility. The second dose should be obtained 4 8 weeks after the first dose.  Human papillomavirus (HPV) vaccine. Females aged 13 26  years who have not received the vaccine previously should obtain the 3-dose series. The vaccine is not recommended for use in pregnant females. However, pregnancy testing is not needed before receiving a dose. If a female is found to be pregnant after receiving a dose, no treatment is needed. In that case, the remaining doses should be delayed until after the pregnancy. Immunization is recommended for any person with an immunocompromised condition through the age of 26 years if she did not get any or all doses earlier. During the 3-dose series, the second dose should be obtained 4 8 weeks after the first dose. The third dose should be obtained 24 weeks after the first dose and 16 weeks after the second dose.  Zoster vaccine. One dose is recommended for adults aged 60 years or older unless certain conditions are present.  Measles, mumps, and rubella (MMR) vaccine. Adults born before 1957 generally are considered immune to measles and mumps. Adults born in 1957 or later should have 1 or more doses of MMR vaccine unless there is a contraindication to the vaccine or there is laboratory evidence of immunity to each of the three diseases. A routine second dose of MMR vaccine should be obtained at least 28 days after the first dose for students attending postsecondary schools, health care workers, or international travelers. People who received inactivated measles vaccine or an unknown type of measles vaccine during 1963 1967 should receive 2 doses of MMR vaccine. People who received inactivated mumps vaccine or an unknown type of mumps vaccine before 1979 and are at high risk for mumps infection should consider immunization with 2 doses of   MMR vaccine. For females of childbearing age, rubella immunity should be determined. If there is no evidence of immunity, females who are not pregnant should be vaccinated. If there is no evidence of immunity, females who are pregnant should delay immunization until after pregnancy.  Unvaccinated health care workers born before [birth year] who lack laboratory evidence of measles, mumps, or rubella immunity or laboratory confirmation of disease should consider measles and mumps immunization with 2 doses of MMR vaccine or rubella immunization with 1 dose of MMR vaccine.  Pneumococcal 13-valent conjugate (PCV13) vaccine. When indicated, a person who is uncertain of her immunization history and has no record of immunization should receive the PCV13 vaccine. An adult aged 54 years or older who has certain medical conditions and has not been previously immunized should receive 1 dose of PCV13 vaccine. This PCV13 should be followed with a dose of pneumococcal polysaccharide (PPSV23) vaccine. The PPSV23 vaccine dose should be obtained at least 8 weeks after the dose of PCV13 vaccine. An adult aged 58 years or older who has certain medical conditions and previously received 1 or more doses of PPSV23 vaccine should receive 1 dose of PCV13. The PCV13 vaccine dose should be obtained 1 or more years after the last PPSV23 vaccine dose.  Pneumococcal polysaccharide (PPSV23) vaccine. When PCV13 is also indicated, PCV13 should be obtained first. All adults aged 58 years and older should be immunized. An adult younger than age 65 years who has certain medical conditions should be immunized. Any person who resides in a nursing home or long-term care facility should be immunized. An adult smoker should be immunized. People with an immunocompromised condition and certain other conditions should receive both PCV13 and PPSV23 vaccines. People with human immunodeficiency virus (HIV) infection should be immunized as soon as possible after diagnosis. Immunization during chemotherapy or radiation therapy should be avoided. Routine use of PPSV23 vaccine is not recommended for American Indians, [place] Natives, or people younger than 65 years unless there are medical conditions that require PPSV23 vaccine. When indicated,  people who have unknown immunization and have no record of immunization should receive PPSV23 vaccine. One-time revaccination 5 years after the first dose of PPSV23 is recommended for people aged 70 64 years who have chronic kidney failure, nephrotic syndrome, asplenia, or immunocompromised conditions. People who received 1 2 doses of PPSV23 before age 32 years should receive another dose of PPSV23 vaccine at age 96 years or later if at least 5 years have passed since the previous dose. Doses of PPSV23 are not needed for people immunized with PPSV23 at or after age 55 years.  Meningococcal vaccine. Adults with asplenia or persistent complement component deficiencies should receive 2 doses of quadrivalent meningococcal conjugate (MenACWY-D) vaccine. The doses should be obtained at least 2 months apart. Microbiologists working with certain meningococcal bacteria, [employer] recruits, people at risk during an outbreak, and people who travel to or live in countries with a high rate of meningitis should be immunized. A first-year college student up through age 58 years who is living in a residence hall should receive a dose if she did not receive a dose on or after her 16th birthday. Adults who have certain high-risk conditions should receive one or more doses of vaccine.  Hepatitis A vaccine. Adults who wish to be protected from this disease, have certain high-risk conditions, work with hepatitis A-infected animals, work in hepatitis A research labs, or travel to or work in countries with a high rate of hepatitis A should be  immunized. Adults who were previously unvaccinated and who anticipate close contact with an international adoptee during the first 60 days after arrival in the [place] from a country with a high rate of hepatitis A should be immunized.  Hepatitis B vaccine.  Adults who wish to be protected from this disease, have certain high-risk conditions, may be exposed to blood or other infectious  body fluids, are household contacts or sex partners of hepatitis B positive people, are clients or workers in certain care facilities, or travel to or work in countries with a high rate of hepatitis B should be immunized.  Haemophilus influenzae type b (Hib) vaccine. A previously unvaccinated person with asplenia or sickle cell disease or having a scheduled splenectomy should receive 1 dose of Hib vaccine. Regardless of previous immunization, a recipient of a hematopoietic stem cell transplant should receive a 3-dose series 6 12 months after her successful transplant. Hib vaccine is not recommended for adults with HIV infection.  Preventive Services / Frequency Ages 6 to 39years  Blood pressure check.** / Every 1 to 2 years.  Lipid and cholesterol check.** / Every 5 years beginning at age 39.  Clinical breast exam.** / Every 3 years for women in their 61s and 62s.  BRCA-related cancer risk assessment.** / For women who have family members with a BRCA-related cancer (breast, ovarian, tubal, or peritoneal cancers).  Pap test.** / Every 2 years from ages 47 through 85. Every 3 years starting at age 34 through age 12 or 74 with a history of 3 consecutive normal Pap tests.  HPV screening.** / Every 3 years from ages 46 through ages 43 to 54 with a history of 3 consecutive normal Pap tests.  Hepatitis C blood test.** / For any individual with known risks for hepatitis C.  Skin self-exam. / Monthly.  Influenza vaccine. / Every year.  Tetanus, diphtheria, and acellular pertussis (Tdap, Td) vaccine.** / Consult your health care provider. Pregnant women should receive 1 dose of Tdap vaccine during each pregnancy. 1 dose of Td every 10 years.  Varicella vaccine.** / Consult your health care provider. Pregnant females who do not have evidence of immunity should receive the first dose after pregnancy.  HPV vaccine. / 3 doses over 6 months, if 64 and younger. The vaccine is not recommended for use in  pregnant females. However, pregnancy testing is not needed before receiving a dose.  Measles, mumps, rubella (MMR) vaccine.** / You need at least 1 dose of MMR if you were born in 1957 or later. You may also need a 2nd dose. For females of childbearing age, rubella immunity should be determined. If there is no evidence of immunity, females who are not pregnant should be vaccinated. If there is no evidence of immunity, females who are pregnant should delay immunization until after pregnancy.  Pneumococcal 13-valent conjugate (PCV13) vaccine.** / Consult your health care provider.  Pneumococcal polysaccharide (PPSV23) vaccine.** / 1 to 2 doses if you smoke cigarettes or if you have certain conditions.  Meningococcal vaccine.** / 1 dose if you are age 71 to 36 years and a Market researcher living in a residence hall, or have one of several medical conditions, you need to get vaccinated against meningococcal disease. You may also need additional booster doses.  Hepatitis A vaccine.** / Consult your health care provider.  Hepatitis B vaccine.** / Consult your health care provider.  Haemophilus influenzae type b (Hib) vaccine.** / Consult your health care provider.  Ages 55 to 64years  Blood pressure check.** / Every 1 to 2 years.  Lipid and cholesterol check.** / Every 5 years beginning at age 20 years.  Lung cancer screening. / Every year if you are aged 55 80 years and have a 30-pack-year history of smoking and currently smoke or have quit within the past 15 years. Yearly screening is stopped once you have quit smoking for at least 15 years or develop a health problem that would prevent you from having lung cancer treatment.  Clinical breast exam.** / Every year after age 40 years.  BRCA-related cancer risk assessment.** / For women who have family members with a BRCA-related cancer (breast, ovarian, tubal, or peritoneal cancers).  Mammogram.** / Every year beginning at age 40  years and continuing for as long as you are in good health. Consult with your health care provider.  Pap test.** / Every 3 years starting at age 30 years through age 65 or 70 years with a history of 3 consecutive normal Pap tests.  HPV screening.** / Every 3 years from ages 30 years through ages 65 to 70 years with a history of 3 consecutive normal Pap tests.  Fecal occult blood test (FOBT) of stool. / Every year beginning at age 50 years and continuing until age 75 years. You may not need to do this test if you get a colonoscopy every 10 years.  Flexible sigmoidoscopy or colonoscopy.** / Every 5 years for a flexible sigmoidoscopy or every 10 years for a colonoscopy beginning at age 50 years and continuing until age 75 years.  Hepatitis C blood test.** / For all people born from 1945 through 1965 and any individual with known risks for hepatitis C.  Skin self-exam. / Monthly.  Influenza vaccine. / Every year.  Tetanus, diphtheria, and acellular pertussis (Tdap/Td) vaccine.** / Consult your health care provider. Pregnant women should receive 1 dose of Tdap vaccine during each pregnancy. 1 dose of Td every 10 years.  Varicella vaccine.** / Consult your health care provider. Pregnant females who do not have evidence of immunity should receive the first dose after pregnancy.  Zoster vaccine.** / 1 dose for adults aged 60 years or older.  Measles, mumps, rubella (MMR) vaccine.** / You need at least 1 dose of MMR if you were born in 1957 or later. You may also need a 2nd dose. For females of childbearing age, rubella immunity should be determined. If there is no evidence of immunity, females who are not pregnant should be vaccinated. If there is no evidence of immunity, females who are pregnant should delay immunization until after pregnancy.  Pneumococcal 13-valent conjugate (PCV13) vaccine.** / Consult your health care provider.  Pneumococcal polysaccharide (PPSV23) vaccine.** / 1 to 2 doses if  you smoke cigarettes or if you have certain conditions.  Meningococcal vaccine.** / Consult your health care provider.  Hepatitis A vaccine.** / Consult your health care provider.  Hepatitis B vaccine.** / Consult your health care provider.  Haemophilus influenzae type b (Hib) vaccine.** / Consult your health care provider.  Ages 65 years and over  Blood pressure check.** / Every 1 to 2 years.  Lipid and cholesterol check.** / Every 5 years beginning at age 20 years.  Lung cancer screening. / Every year if you are aged 55 80 years and have a 30-pack-year history of smoking and currently smoke or have quit within the past 15 years. Yearly screening is stopped once you have quit smoking for at least 15 years or develop a health problem that   would prevent you from having lung cancer treatment.  Clinical breast exam.** / Every year after age 103 years.  BRCA-related cancer risk assessment.** / For women who have family members with a BRCA-related cancer (breast, ovarian, tubal, or peritoneal cancers).  Mammogram.** / Every year beginning at age 36 years and continuing for as long as you are in good health. Consult with your health care provider.  Pap test.** / Every 3 years starting at age 5 years through age 85 or 10 years with 3 consecutive normal Pap tests. Testing can be stopped between 65 and 70 years with 3 consecutive normal Pap tests and no abnormal Pap or HPV tests in the past 10 years.  HPV screening.** / Every 3 years from ages 93 years through ages 70 or 45 years with a history of 3 consecutive normal Pap tests. Testing can be stopped between 65 and 70 years with 3 consecutive normal Pap tests and no abnormal Pap or HPV tests in the past 10 years.  Fecal occult blood test (FOBT) of stool. / Every year beginning at age 8 years and continuing until age 45 years. You may not need to do this test if you get a colonoscopy every 10 years.  Flexible sigmoidoscopy or colonoscopy.** /  Every 5 years for a flexible sigmoidoscopy or every 10 years for a colonoscopy beginning at age 69 years and continuing until age 68 years.  Hepatitis C blood test.** / For all people born from [birth year] through 1966 and any individual with known risks for hepatitis C.  Osteoporosis screening.** / A one-time screening for women ages 7 years and over and women at risk for fractures or osteoporosis.  Skin self-exam. / Monthly.  Influenza vaccine. / Every year.  Tetanus, diphtheria, and acellular pertussis (Tdap/Td) vaccine.** / 1 dose of Td every 10 years.  Varicella vaccine.** / Consult your health care provider.  Zoster vaccine.** / 1 dose for adults aged 5 years or older.  Pneumococcal 13-valent conjugate (PCV13) vaccine.** / Consult your health care provider.  Pneumococcal polysaccharide (PPSV23) vaccine.** / 1 dose for all adults aged 74 years and older.  Meningococcal vaccine.** / Consult your health care provider.  Hepatitis A vaccine.** / Consult your health care provider.  Hepatitis B vaccine.** / Consult your health care provider.  Haemophilus influenzae type b (Hib) vaccine.** / Consult your health care provider. ** Family history and personal history of risk and conditions may change your health care provider's recommendations. Document Released: 03/03/2001 Document Revised: 10/26/2012  [facility] Patient Information 2014 [place], [place].   EXERCISE AND DIET:  We recommended that you start or continue a regular exercise program for good health. Regular exercise means any activity that makes your heart beat faster and makes you sweat.  We recommend exercising at least 30 minutes per day at least 3 days a week, preferably 5.  We also recommend a diet low in fat and sugar / carbohydrates.  Inactivity, poor dietary choices and obesity can cause diabetes, heart attack, stroke, and kidney damage, among others.     ALCOHOL AND SMOKING:  Women should limit their alcohol intake to no  more than 7 drinks/beers/glasses of wine (combined, not each!) per week. Moderation of alcohol intake to this level decreases your risk of breast cancer and liver damage.  ( And of course, no recreational drugs are part of a healthy lifestyle.)  Also, you should not be smoking at all or even being exposed to second hand smoke. Most people know smoking can  cause cancer, and various heart and lung diseases, but did you know it also contributes to weakening of your bones?  Aging of your skin?  Yellowing of your teeth and nails?   CALCIUM AND VITAMIN D:  Adequate intake of calcium and Vitamin D are recommended.  The recommendations for exact amounts of these supplements seem to change often, but generally speaking 600 mg of calcium (either carbonate or citrate) and 800 units of Vitamin D per day seems prudent. Certain women may benefit from higher intake of Vitamin D.  If you are among these women, your doctor will have told you during your visit.     PAP SMEARS:  Pap smears, to check for cervical cancer or precancers,  have traditionally been done yearly, although recent scientific advances have shown that most women can have pap smears less often.  However, every woman still should have a physical exam from her gynecologist or primary care physician every year. It will include a breast check, inspection of the vulva and vagina to check for abnormal growths or skin changes, a visual exam of the cervix, and then an exam to evaluate the size and shape of the uterus and ovaries.  And after 58 years of age, a rectal exam is indicated to check for rectal cancers. We will also provide age appropriate advice regarding health maintenance, like when you should have certain vaccines, screening for sexually transmitted diseases, bone density testing, colonoscopy, mammograms, etc.    MAMMOGRAMS:  All women over 71 years old should have a yearly mammogram. Many facilities now offer a "3D" mammogram, which may cost  around $50 extra out of pocket. If possible,  we recommend you accept the option to have the 3D mammogram performed.  It both reduces the number of women who will be called back for extra views which then turn out to be normal, and it is better than the routine mammogram at detecting truly abnormal areas.     COLONOSCOPY:  Colonoscopy to screen for colon cancer is recommended for all women at age 52.  We know, you hate the idea of the prep.  We agree, BUT, having colon cancer and not knowing it is worse!!  Colon cancer so often starts as a polyp that can be seen and removed at colonscopy, which can quite literally save your life!  And if your first colonoscopy is normal and you have no family history of colon cancer, most women don't have to have it again for 10 years.  Once every ten years, you can do something that may end up saving your life, right?  We will be happy to help you get it scheduled when you are ready.  Be sure to check your insurance coverage so you understand how much it will cost.  It may be covered as a preventative service at no cost, but you should check your particular policy.

## 2017-05-04 LAB — LIPID PANEL
Chol/HDL Ratio: 4.3 ratio (ref 0.0–4.4)
Cholesterol, Total: 236 mg/dL — ABNORMAL HIGH (ref 100–199)
HDL: 55 mg/dL (ref >39)
LDL Calculated: 150 mg/dL — ABNORMAL HIGH (ref 0–99)
TRIGLYCERIDES: 156 mg/dL — AB (ref 0–149)
VLDL Cholesterol Cal: 31 mg/dL (ref 5–40)

## 2017-05-04 LAB — COMPREHENSIVE METABOLIC PANEL
ALBUMIN: 4.5 g/dL (ref 3.5–5.5)
ALT: 23 IU/L (ref 0–32)
AST: 20 IU/L (ref 0–40)
Albumin/Globulin Ratio: 2 (ref 1.2–2.2)
Alkaline Phosphatase: 74 IU/L (ref 39–117)
BILIRUBIN TOTAL: 0.3 mg/dL (ref 0.0–1.2)
BUN / CREAT RATIO: 17 (ref 9–23)
BUN: 14 mg/dL (ref 6–24)
CHLORIDE: 101 mmol/L (ref 96–106)
CO2: 25 mmol/L (ref 20–29)
Calcium: 9.6 mg/dL (ref 8.7–10.2)
Creatinine, Ser: 0.83 mg/dL (ref 0.57–1.00)
GFR calc Af Amer: 90 mL/min/{1.73_m2} (ref >59)
GFR calc non Af Amer: 78 mL/min/{1.73_m2} (ref >59)
GLUCOSE: 93 mg/dL (ref 65–99)
Globulin, Total: 2.3 g/dL (ref 1.5–4.5)
Potassium: 4.9 mmol/L (ref 3.5–5.2)
Sodium: 140 mmol/L (ref 134–144)
Total Protein: 6.8 g/dL (ref 6.0–8.5)

## 2017-05-04 LAB — CBC WITH DIFFERENTIAL/PLATELET
Basophils Absolute: 0 10*3/uL (ref 0.0–0.2)
Basos: 0 %
EOS (ABSOLUTE): 0.2 10*3/uL (ref 0.0–0.4)
Eos: 3 %
HEMOGLOBIN: 13.2 g/dL (ref 11.1–15.9)
Hematocrit: 40.9 % (ref 34.0–46.6)
Immature Grans (Abs): 0 10*3/uL (ref 0.0–0.1)
Immature Granulocytes: 0 %
LYMPHS ABS: 1.6 10*3/uL (ref 0.7–3.1)
Lymphs: 30 %
MCH: 29.5 pg (ref 26.6–33.0)
MCHC: 32.3 g/dL (ref 31.5–35.7)
MCV: 92 fL (ref 79–97)
MONOCYTES: 10 %
Monocytes Absolute: 0.6 10*3/uL (ref 0.1–0.9)
Neutrophils Absolute: 3.1 10*3/uL (ref 1.4–7.0)
Neutrophils: 57 %
Platelets: 325 10*3/uL (ref 150–379)
RBC: 4.47 x10E6/uL (ref 3.77–5.28)
RDW: 13.8 % (ref 12.3–15.4)
WBC: 5.5 10*3/uL (ref 3.4–10.8)

## 2017-05-04 LAB — CYTOLOGY - PAP
Diagnosis: NEGATIVE
HPV: NOT DETECTED

## 2017-05-04 LAB — HEMOGLOBIN A1C
Est. average glucose Bld gHb Est-mCnc: 114 mg/dL
HEMOGLOBIN A1C: 5.6 % (ref 4.8–5.6)

## 2017-05-04 LAB — VITAMIN D 25 HYDROXY (VIT D DEFICIENCY, FRACTURES): Vit D, 25-Hydroxy: 32.3 ng/mL (ref 30.0–100.0)

## 2017-05-04 LAB — TSH: TSH: 2.34 u[IU]/mL (ref 0.450–4.500)

## 2017-05-12 ENCOUNTER — Encounter: Payer: Self-pay | Admitting: Family Medicine

## 2017-05-24 ENCOUNTER — Other Ambulatory Visit (INDEPENDENT_AMBULATORY_CARE_PROVIDER_SITE_OTHER): Payer: BLUE CROSS/BLUE SHIELD

## 2017-05-24 DIAGNOSIS — Z1211 Encounter for screening for malignant neoplasm of colon: Secondary | ICD-10-CM | POA: Diagnosis not present

## 2017-05-24 LAB — IFOBT (OCCULT BLOOD)
IFOBT: NEGATIVE
IMMUNOLOGICAL FECAL OCCULT BLOOD TEST: NEGATIVE
IMMUNOLOGICAL FECAL OCCULT BLOOD TEST: NEGATIVE

## 2017-05-24 NOTE — Progress Notes (Signed)
IFOB tests only

## 2017-06-15 ENCOUNTER — Other Ambulatory Visit: Payer: Self-pay | Admitting: Family Medicine

## 2017-06-15 DIAGNOSIS — Z7184 Encounter for health counseling related to travel: Secondary | ICD-10-CM

## 2017-06-15 MED ORDER — CHLOROQUINE PHOSPHATE 250 MG PO TABS: 500.0000 mg | ORAL_TABLET | ORAL | 0 refills | Status: DC

## 2017-06-15 MED ORDER — FLUOXETINE HCL 40 MG PO CAPS: 40.0000 mg | ORAL_CAPSULE | Freq: Every day | ORAL | 0 refills | Status: DC

## 2017-09-17 ENCOUNTER — Encounter: Payer: Self-pay | Admitting: Family Medicine

## 2017-11-05 DIAGNOSIS — N950 Postmenopausal bleeding: Secondary | ICD-10-CM | POA: Diagnosis not present

## 2017-11-05 DIAGNOSIS — N841 Polyp of cervix uteri: Secondary | ICD-10-CM | POA: Diagnosis not present

## 2017-11-05 DIAGNOSIS — N840 Polyp of corpus uteri: Secondary | ICD-10-CM | POA: Diagnosis not present

## 2017-11-15 DIAGNOSIS — R9389 Abnormal findings on diagnostic imaging of other specified body structures: Secondary | ICD-10-CM | POA: Diagnosis not present

## 2017-11-15 DIAGNOSIS — N950 Postmenopausal bleeding: Secondary | ICD-10-CM | POA: Diagnosis not present

## 2017-12-01 DIAGNOSIS — N841 Polyp of cervix uteri: Secondary | ICD-10-CM | POA: Diagnosis not present

## 2018-02-04 ENCOUNTER — Other Ambulatory Visit: Payer: Self-pay | Admitting: Family Medicine

## 2018-02-20 IMAGING — DX DG FINGER MIDDLE 2+V*L*
3 series · 3 of 3 positions shown · non-contrast
Comparison: None

CLINICAL DATA: Smashed LEFT middle finger in the shower door this
morning, pain and bruising

EXAM:
LEFT MIDDLE FINGER 2+V

[finger ap]
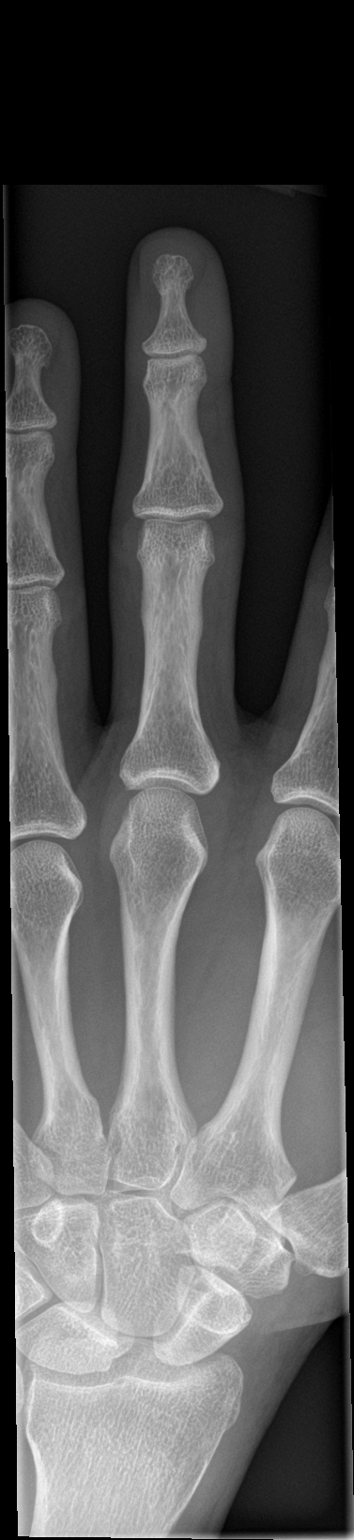

[finger obl]
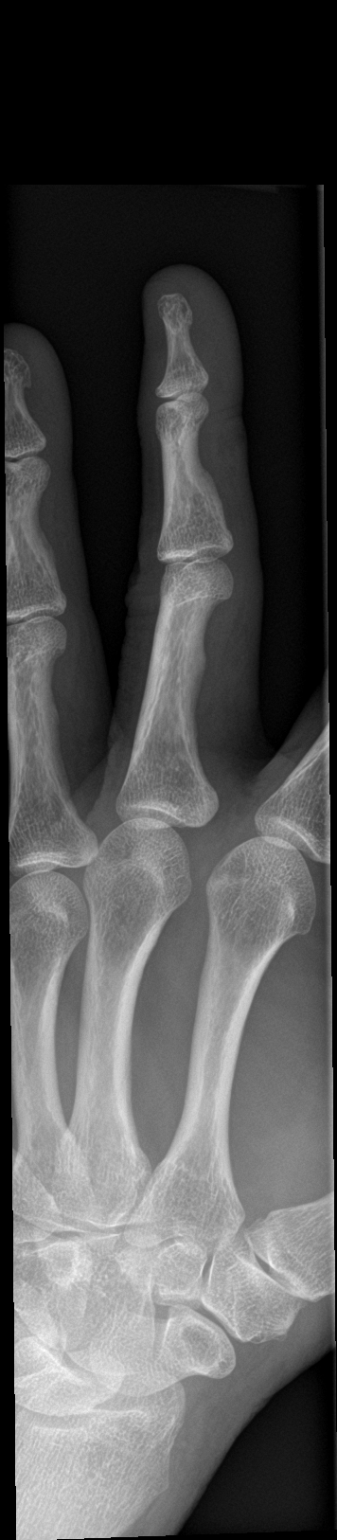

[finger lat]
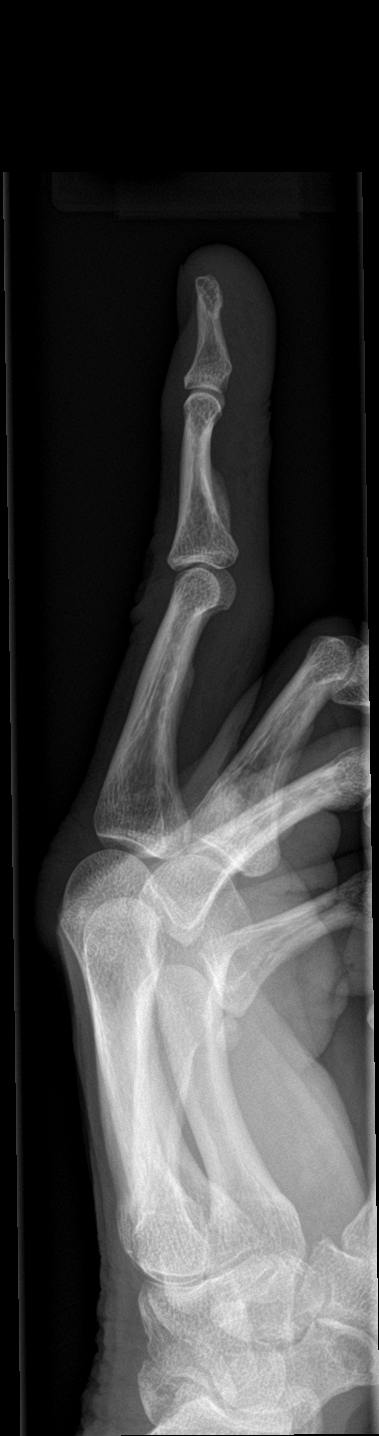

[3 of 3 positions shown; findings below may reference images not displayed]

FINDINGS: Osseous mineralization normal.

Joint spaces preserved.

No acute fracture, dislocation, or bone destruction.
IMPRESSION: No acute osseous abnormalities.

## 2018-02-23 ENCOUNTER — Other Ambulatory Visit: Payer: Self-pay | Admitting: Family Medicine

## 2018-02-23 ENCOUNTER — Ambulatory Visit (INDEPENDENT_AMBULATORY_CARE_PROVIDER_SITE_OTHER): Payer: BLUE CROSS/BLUE SHIELD | Admitting: Family Medicine

## 2018-02-23 ENCOUNTER — Encounter: Payer: Self-pay | Admitting: Family Medicine

## 2018-02-23 VITALS — BP 127/84 | HR 60 | Temp 98.3°F | Ht 65.0 in | Wt 140.9 lb

## 2018-02-23 DIAGNOSIS — J019 Acute sinusitis, unspecified: Secondary | ICD-10-CM

## 2018-02-23 DIAGNOSIS — R51 Headache: Secondary | ICD-10-CM | POA: Diagnosis not present

## 2018-02-23 DIAGNOSIS — F4323 Adjustment disorder with mixed anxiety and depressed mood: Secondary | ICD-10-CM

## 2018-02-23 DIAGNOSIS — R519 Headache, unspecified: Secondary | ICD-10-CM

## 2018-02-23 MED ORDER — FLUTICASONE PROPIONATE 50 MCG/ACT NA SUSP: NASAL | 2 refills | Status: DC

## 2018-02-23 MED ORDER — PREDNISONE 20 MG PO TABS
ORAL_TABLET | ORAL | 0 refills | Status: DC
Start: 2018-02-23 — End: 2019-03-24

## 2018-02-23 MED ORDER — FLUOXETINE HCL 40 MG PO CAPS: 40.0000 mg | ORAL_CAPSULE | Freq: Every day | ORAL | 1 refills | Status: DC

## 2018-02-23 MED ORDER — PREDNISONE 20 MG PO TABS: ORAL_TABLET | ORAL | 0 refills | Status: DC

## 2018-02-23 NOTE — Patient Instructions (Signed)
As we discussed please work on lifestyle modifications to help improve mood and energy levels.        What causes depression?  You may already know that depression (MDD) is a complex medical condition. The exact cause is unknown, but it's most likely a combination of several factors: genetic, biological, environmental, and psychological.  Depression can run in families, but not everyone with depression has a family history. Scientists are trying to identify genes involved to understand the role that genetics may play in depression.  Environmental factors that may play a role in depression include trauma, loss of a loved one, a difficult relationship, or other stressful situations. Some depressive episodes may occur without an obvious trigger.  If you think you may have depression, there is something you can do about it. The first step is to talk to your healthcare professional about your symptoms.  You can also read about the symptoms of depression below to help you get a better understanding about the types of changes you may be experiencing.    Depression and the brain:  It's thought that certain neurotransmitters such as serotonin, norepinephrine, and dopamine (chemicals that the brain uses to communicate) are out of balance when you're depressed  Serotonin is one of the chemicals believed to be affected by depression - it is thought to be involved in the regulation of mood and other functions    What are the symptoms of depression?  Emotional. Physical. Cognitive.  1. Little interest or pleasure in doing things 2. Feeling down, depressed, or hopeless 3. Trouble falling or staying asleep, or sleeping too much 4. Feeling tired or having little energy 5. Poor appetite, overeating, or considerable weight changes 6. Feeling bad about yourself - that you are a failure or having a lot of guilt 7. Difficulty concentrating on things or making decisions 8. Moving or speaking  slowly, so that other people have noticed, or being so restless that you've been moving around a lot 9. Thoughts that you would be better off dead, or of hurting yourself in some way   Depression is a complex medical condition that may make it hard to feel like yourself. Talk to your healthcare professional about all of your symptoms and their impact.   You may be suffering from depression if you are experiencing five or more of the symptoms above including either depressed mood or decreased interest or pleasure. In addition, depression is also associated with:   Symptoms that are new or noticeably worse compared to what they were prior to the episode  Symptoms that persist for most of the day, nearly every day for at least two consecutive weeks  Episodes that are accompanied by clinically significant distress or impaired functioning    Myth:  Depression is something you can just "snap out of".   Fact:  No one chooses to be depressed, and it is not caused by laziness, weakness, or simply feeling sad. Depression is a complex medical condition. The exact cause is unknown, but it's most likely a combination of several factors: genetic, biological, environmental, and psychological. You cannot control your loved one's recovery, but you can support them along the way.   Myth:  Depression only affects your emotions  Fact:  Depression can be emotional, physical, and cognitive symptoms. It can result in symptoms that include little interest or pleasure in doing things; feeling down, depressed, or hopeless; trouble falling or staying asleep, or sleeping too much; feeling tired or having little energy; poor  appetite, overeating, or considerable weight changes; feeling bad about yourself, like you are a failure or having a lot of guilt; trouble concentrating on things or making decisions; moving or speaking slowly, so that other people have noticed, or being so restless that you've been moving  around a lot; and thoughts that you would be better off dead, or of hurting yourself in some way. People with depression do not all experience the same symptoms, and the severity, frequency, and duration can vary

## 2018-02-23 NOTE — Progress Notes (Signed)
Impression and Recommendations:    1. Subacute sinusitis, unspecified location   2. Sinus headache   3. Adjustment disorder with mixed anxiety and depressed mood      1. Chronic Sinus Headaches & Concerns - For preventative maintenance of her sinuses, advised the patient to begin using AYR or Neilmed sinus rinses BID followed by flonase BID (one spray to each nostril). Advised that the patient may also incorporate allegra or claritin PRN.   - Viral vs Allergic vs Bacterial causes for pt's symptoms reveiwed.   - Supportive care and various OTC medications discussed. - Prednisone taper prescribed today.  - Encouraged patient to push hydration, consume adequate water, and consume warm liquids. - Encouraged use of humidifier at night.  - Call or RTC if new symptoms, or if no improvement or worse over next several days.     2. Mood Management - Mood not optimally managed at this time. - Continue treatment plan as prescribed, and engage in lifestyle changes.  - Patient tolerating meds well without complication.  Denies S-E.  See med list.  - Encouraged patient to leave the house and seek external stimuli, things to look forward to.  - Encouraged patient to engage with local friends to help motivate her to participate in the community. - Advised patient to reach out to her friends and see who will support her.  - Encouraged patient to practice meditation for gratitude, and engage in a gratitude journal.  - Advised patient to continue working toward exercising to improve overall mental, physical, and emotional health.  Per patient, would feel more motivated if she had people to hold her accountable.  - Healthy dietary habits encouraged, including low-carb, and high amounts of lean protein in diet.   - Reviewed the "spokes of the wheel" of mood and health management.  Stressed the importance of ongoing prudent habits, including regular exercise, appropriate sleep hygiene,  healthful dietary habits, and prayer/meditation to relax.   Education and routine counseling performed. Handouts provided.   Medications Discontinued During This Encounter  Medication Reason  . FLUoxetine (PROZAC) 40 MG capsule Reorder  . fluticasone (FLONASE) 50 MCG/ACT nasal spray Reorder  . predniSONE (DELTASONE) 20 MG tablet Reorder     Meds ordered this encounter  Medications  . FLUoxetine (PROZAC) 40 MG capsule    Sig: Take 1 capsule (40 mg total) by mouth daily.    Dispense:  90 capsule    Refill:  1  . DISCONTD: predniSONE (DELTASONE) 20 MG tablet    Sig: Take 3 pills a day for 2 days, 2 pills a day for 2 days, 1 pill a day for 2 days then one half pill a day for 2 days then off    Dispense:  14 tablet    Refill:  0  . DISCONTD: fluticasone (FLONASE) 50 MCG/ACT nasal spray    Sig: 1 spray each nostril following sinus rinses twice daily    Dispense:  16 g    Refill:  2  . fluticasone (FLONASE) 50 MCG/ACT nasal spray    Sig: 1 spray each nostril following sinus rinses twice daily    Dispense:  16 g    Refill:  2  . predniSONE (DELTASONE) 20 MG tablet    Sig: Take 3 pills a day for 2 days, 2 pills a day for 2 days, 1 pill a day for 2 days then one half pill a day for 2 days then off    Dispense:  14 tablet    Refill:  0    Gross side effects, risk and benefits, and alternatives of medications and treatment plan in general discussed with patient.  Patient is aware that all medications have potential side effects and we are unable to predict every side effect or drug-drug interaction that may occur.   Patient will call with any questions prior to using medication if they have concerns.  Expresses verbal understanding and consents to current therapy and treatment regimen.  No barriers to understanding were identified.  Red flag symptoms and signs discussed in detail.  Patient expressed understanding regarding what to do in case of emergency\urgent symptoms  Please see AVS  handed out to patient at the end of our visit for further patient instructions/ counseling done pertaining to today's office visit.   Return for Follow-up 4 to 6 months or sooner if issues.     Note:  This document was prepared using Dragon voice recognition software and may include unintentional dictation errors.   This document serves as a record of services personally performed by Angelica Dance, DO. It was created on her behalf by Angelica Knight, a trained medical scribe. The creation of this record is based on the scribe's personal observations and the provider's statements to them.   I have reviewed the above medical documentation for accuracy and completeness and I concur.  Angelica Dance, DO 02/23/2018 5:01 PM      --------------------------------------------------------------------------------------------------------------------------------------------------------------------------------------------------------------------------------------------    Subjective:    CC:  Chief Complaint  Patient presents with  . Follow-up    HPI: Angelica Knight is a 58 y.o. female who presents to [place] at [facility] today for follow-up of mood.   Recent Sinus Concerns Patient states that she recently spent two weeks on the sofa with sinus headaches.  Currently has a sinus headache.  Says "I get these all the time, and basically I've pushed through."  Denies fever or chills.  States she's had a cough.  On day 2-3 of her current symptoms, she was sweating profusely.  Her symptoms are improving now, but she was in bed for a solid week, commenting "did not get out of bed for a week."  To treat, she used sinus rinses and Excedrin Migraine Formula.  Has been sick for two weeks, and feels more depressed because she's been sick for two weeks.  Patient feels that her current mood is exaggerated to be worse due to her recent illness.   States she knows she doesn't  drink enough water "because it makes me pee a lot."  Mood Management - Worse due to Recent Illness Mood is worse at present.  Says "if I didn't have anything to do, I would literally lay on the sofa all day."  States she currently has no motivation to do anything, "and if I didn't have volunteering forcing me to get up and out, I wouldn't do anything."  Continues to feel ill from her sinus headaches.  Patient is at home a lot and not working.  Notes she goes to a weekly Bible study, and has resources available to her, but she does not utilize these as often as she should.  She was formerly going to a gym, but stopped because it was 30 miles away.  Notes she would do better if she had someone to hold her accountable for changes.  Her husband is still working and works out every day at home.  She's just not motivated to do it  on her own.   Depression screen [facility] 2/9 02/23/2018 05/03/2017 06/23/2016  Decreased Interest 3 1 1   Down, Depressed, Hopeless 0 1 0  PHQ - 2 Score 3 2 1   Altered sleeping 1 2 0  Tired, decreased energy 3 2 0  Change in appetite 0 1 0  Feeling bad or failure about yourself  1 0 0  Trouble concentrating 3 0 0  Moving slowly or fidgety/restless 1 0 0  Suicidal thoughts 0 0 0  PHQ-9 Score 12 7 1   Difficult doing work/chores Somewhat difficult Somewhat difficult -     GAD 7 : Generalized Anxiety Score 02/23/2018 06/23/2016  Nervous, Anxious, on Edge 0 0  Control/stop worrying 0 0  Worry too much - different things 0 0  Trouble relaxing 0 0  Restless 0 0  Easily annoyed or irritable 0 0  Afraid - awful might happen 0 0  Total GAD 7 Score 0 0     Wt Readings from Last 3 Encounters:  02/23/18 140 lb 14.4 oz (63.9 kg)  05/03/17 138 lb 6.4 oz (62.8 kg)  06/23/16 130 lb (59 kg)   BP Readings from Last 3 Encounters:  02/23/18 127/84  05/03/17 130/82  11/06/16 (!) 129/48   Pulse Readings from Last 3 Encounters:  02/23/18 60  05/03/17 67  11/06/16 67   BMI  Readings from Last 3 Encounters:  02/23/18 23.45 kg/m  05/03/17 23.03 kg/m  06/23/16 21.63 kg/m         Patient Care Team    Relationship Specialty Notifications Start End  Angelica Dance, DO PCP - General Family Medicine  07/11/15      Patient Active Problem List   Diagnosis Date Noted  . Hypertriglyceridemia 11/24/2015    Priority: High  . Adjustment disorder with mixed anxiety and depressed mood 08/29/2015    Priority: High  . Mixed hyperlipidemia 08/29/2015    Priority: High  . History of migraine headaches 11/24/2015    Priority: Medium  . Vitamin D deficiency 09/10/2015    Priority: Medium  . Atypical nevi 11/24/2015    Priority: Low  . Encounter for wellness examination 05/03/2017  . Papanicolaou smear for cervical cancer screening 05/03/2017  . Travel advice encounter 06/23/2016  . Right bundle branch block 08/29/2015  . Nonspecific chest pain 08/29/2015  . Dyspareunia 05/03/2014  . Frequent headaches 05/03/2014  . Depression 05/03/2014    Past Medical history, Surgical history, Family history, Social history, Allergies and Medications have been entered into the medical record, reviewed and changed as needed.    Current Meds  Medication Sig  . Calcium Carbonate-Vitamin D 600-400 MG-UNIT tablet Take at least 2000 vitamin D per day and at least 1200 of calcium per day  . FLUoxetine (PROZAC) 40 MG capsule Take 1 capsule (40 mg total) by mouth daily.  . [DISCONTINUED] FLUoxetine (PROZAC) 40 MG capsule Take 1 capsule (40 mg total) by mouth daily. Patient needs office visit for further refills    Allergies:  No Known Allergies   Review of Systems: Review of Systems: General:   No F/C, wt loss Pulm:   No DIB, SOB, pleuritic chest pain Card:  No CP, palpitations Abd:  No n/v/d or pain Ext:  No inc edema from baseline Psych: no SI/ HI    Objective:   Blood pressure 127/84, pulse 60, temperature 98.3 F (36.8 C), height 5\' 5"  (1.651 m), weight 140  lb 14.4 oz (63.9 kg), SpO2 98 %. Body mass index is  23.45 kg/m. General:  Well Developed, well nourished, appropriate for stated age.  Neuro:  Alert and oriented,  extra-ocular muscles intact  HEENT:  Normocephalic, atraumatic, neck supple, no carotid bruits appreciated  Skin:  no gross rash, warm, pink. Cardiac:  RRR, S1 S2 Respiratory:  ECTA B/L and A/P, Not using accessory muscles, speaking in full sentences- unlabored. Vascular:  Ext warm, no cyanosis apprec.; cap RF less 2 sec. Psych:  No HI/SI, judgement and insight good, Euthymic mood. Full Affect.

## 2018-05-04 ENCOUNTER — Encounter: Payer: Self-pay | Admitting: Family Medicine

## 2018-05-12 ENCOUNTER — Encounter: Payer: Self-pay | Admitting: Family Medicine

## 2018-09-02 ENCOUNTER — Other Ambulatory Visit: Payer: Self-pay | Admitting: Family Medicine

## 2018-09-02 DIAGNOSIS — F4323 Adjustment disorder with mixed anxiety and depressed mood: Secondary | ICD-10-CM

## 2019-01-09 DIAGNOSIS — Z20828 Contact with and (suspected) exposure to other viral communicable diseases: Secondary | ICD-10-CM | POA: Diagnosis not present

## 2019-01-29 ENCOUNTER — Other Ambulatory Visit: Payer: Self-pay | Admitting: Family Medicine

## 2019-01-29 DIAGNOSIS — F4323 Adjustment disorder with mixed anxiety and depressed mood: Secondary | ICD-10-CM

## 2019-02-05 ENCOUNTER — Other Ambulatory Visit: Payer: Self-pay | Admitting: Family Medicine

## 2019-02-05 ENCOUNTER — Encounter: Payer: Self-pay | Admitting: Family Medicine

## 2019-02-05 DIAGNOSIS — F4323 Adjustment disorder with mixed anxiety and depressed mood: Secondary | ICD-10-CM

## 2019-03-15 ENCOUNTER — Other Ambulatory Visit: Payer: Self-pay | Admitting: Family Medicine

## 2019-03-15 DIAGNOSIS — F4323 Adjustment disorder with mixed anxiety and depressed mood: Secondary | ICD-10-CM

## 2019-03-15 NOTE — Telephone Encounter (Signed)
Patient called again seeking Prozac refill --advised :  FLUoxetine (PROZAC) 40 MG capsule [id number]   Order Details Dose, Route, Frequency: As Directed  Dispense Quantity: 15 capsule Refills: 0       Sig: TAKE 1 CAPSULE DAILY. NEED OFFICE VISIT FOR FURTHER  REFILLS       ---Forwarding message that pt scheduled appt w/ provider @ 1st available (3/5) & she really needs her medication--advised pt would send message to med asst to see if they can call in at least enough to get her to the appt date.  --glh

## 2019-03-16 MED ORDER — FLUOXETINE HCL 40 MG PO CAPS: ORAL_CAPSULE | ORAL | 0 refills | Status: DC

## 2019-03-24 ENCOUNTER — Other Ambulatory Visit: Payer: Self-pay

## 2019-03-24 ENCOUNTER — Encounter: Payer: Self-pay | Admitting: Family Medicine

## 2019-03-24 ENCOUNTER — Ambulatory Visit (INDEPENDENT_AMBULATORY_CARE_PROVIDER_SITE_OTHER): Payer: BC Managed Care – PPO | Admitting: Family Medicine

## 2019-03-24 VITALS — Ht 60.5 in | Wt 140.0 lb

## 2019-03-24 DIAGNOSIS — F4323 Adjustment disorder with mixed anxiety and depressed mood: Secondary | ICD-10-CM

## 2019-03-24 DIAGNOSIS — M7711 Lateral epicondylitis, right elbow: Secondary | ICD-10-CM | POA: Diagnosis not present

## 2019-03-24 DIAGNOSIS — Z719 Counseling, unspecified: Secondary | ICD-10-CM

## 2019-03-24 MED ORDER — FLUOXETINE HCL 40 MG PO CAPS: ORAL_CAPSULE | ORAL | 0 refills | Status: DC

## 2019-03-24 NOTE — Patient Instructions (Signed)
Tennis Elbow  Tennis elbow (lateral epicondylitis) is inflammation of tendons in your outer forearm, near your elbow. Tendons are tissues that connect muscle to bone. When you have tennis elbow, inflammation affects the tendons that you use to bend your wrist and move your hand up. Inflammation occurs in the lower part of the upper arm bone (humerus), where the tendons connect to the bone (lateral epicondyle). Tennis elbow often affects people who play tennis, but anyone may get the condition from repeatedly extending the wrist or turning the forearm. What are the causes? This condition is usually caused by repeatedly extending the wrist, turning the forearm, and using the hands. It can result from sports or work that requires repetitive forearm movements. In some cases, it may be caused by a sudden injury. What increases the risk? You are more likely to develop tennis elbow if you play tennis or another racket sport. You also have a higher risk if you frequently use your hands for work. Besides people who play tennis, others at greater risk include:  Musicians.  Carpenters, painters, and plumbers.  Cooks.  Cashiers.  People who work in factories.  Construction workers.  Butchers.  People who use computers. What are the signs or symptoms? Symptoms of this condition include:  Pain and tenderness in the forearm and the outer part of the elbow. Pain may be felt only when using the arm, or it may be there all the time.  A burning feeling that starts in the elbow and spreads down the forearm.  A weak grip in the hand. How is this diagnosed? This condition may be diagnosed based on:  Your symptoms and medical history.  A physical exam.  X-rays.  MRI. How is this treated? Resting and icing your arm is often the first treatment. Your health care provider may also recommend:  Medicines to reduce pain and inflammation. These may be in the form of a pill, topical gels, or shots of a  steroid medicine (cortisone).  An elbow strap to reduce stress on the area.  Physical therapy. This may include massage or exercises.  An elbow brace to restrict the movements that cause symptoms. If these treatments do not help relieve your symptoms, your health care provider may recommend surgery to remove damaged muscle and reattach healthy muscle to bone. Follow these instructions at home: Activity  Rest your elbow and wrist and avoid activities that cause symptoms, as told by your health care provider.  Do physical therapy exercises as instructed.  If you lift an object, lift it with your palm facing up. This reduces stress on your elbow. Lifestyle  If your tennis elbow is caused by sports, check your equipment and make sure that: ? You are using it correctly. ? It is the best fit for you.  If your tennis elbow is caused by work or computer use, take frequent breaks to stretch your arm. Talk with your manager about ways to manage your condition at work. If you have a brace:  Wear the brace or strap as told by your health care provider. Remove it only as told by your health care provider.  Loosen the brace if your fingers tingle, become numb, or turn cold and blue.  Keep the brace clean.  If the brace is not waterproof, ask if you may remove it for bathing. If you must keep the brace on while bathing: ? Do not let it get wet. ? Cover it with a watertight covering when you take a   bath or a shower. General instructions   If directed, put ice on the painful area: ? Put ice in a plastic bag. ? Place a towel between your skin and the bag. ? Leave the ice on for 20 minutes, 2-3 times a day.  Take over-the-counter and prescription medicines only as told by your health care provider.  Keep all follow-up visits as told by your health care provider. This is important. Contact a health care provider if:  You have pain that gets worse or does not get better with  treatment.  You have numbness or weakness in your forearm, hand, or fingers. Summary  Tennis elbow (lateral epicondylitis) is inflammation of tendons in your outer forearm, near your elbow.  Common symptoms include pain and tenderness in your forearm and the outer part of your elbow.  This condition is usually caused by repeatedly extending your wrist, turning your forearm, and using your hands.  The first treatment is often resting and icing your arm to relieve symptoms. Further treatment may include taking medicine, getting physical therapy, wearing a brace or strap, or having surgery. This information is not intended to replace advice given to you by your health care provider. Make sure you discuss any questions you have with your health care provider. Document Revised: 10/01/2017 Document Reviewed: 10/20/2016 Elsevier Patient Education  2020 [employer]  

## 2019-03-24 NOTE — Progress Notes (Signed)
Telehealth office visit note for Angelica Knight, D.O- at Primary Care at [facility]   I connected with current patient today and verified that I am speaking with the correct person using two identifiers.   . Location of the patient: Home . Location of the provider: Office Only the patient (+/- their family members at pt's discretion) and myself were participating in the encounter - This visit type was conducted due to national recommendations for restrictions regarding the COVID-19 Pandemic (e.g. social distancing) in an effort to limit this patient's exposure and mitigate transmission in our community.  This format is felt to be most appropriate for this patient at this time.   - No physical exam could be performed with this format, beyond that communicated to [place] by the patient/ family members as noted.   - Additionally my office staff/ schedulers discussed with the patient that there may be a monetary charge related to this service, depending on their medical insurance.   The patient expressed understanding, and agreed to proceed.     _________________________________________________________________________________   History of Present Illness: No chief complaint on file.    I, Toni Amend, am serving as Education administrator for [name].  - Mood Management Says "I recognize my mood issue is a lot to do with a motivational factor."  Notes "for example, just getting up and getting dressed and getting moving, helps with my motivation, but I have a motivational issue."  Says "once I get going and moving and active, it's better, but that's always been my struggle in my adult years."  Notes she has had counselors in the past, over several years, and currently trying to get her daughter to see a Social worker.  Says right now she does not desire referral to counseling because "I'm not in such dire straights as my daughter, who is in desperate need of meds and counseling."  Adds "I recognize  my situation now."  Says "we now have horses, and that kind of keeps me motivated in the sense of 'I have to get up and tend to them.'"  Says "something that gets me up and moving is what I need, or I sit in the chair all day long and do nothing."  She continues going to [employer].  Notes this summer, she experimented and tried to up her dose of Prozac (40 mg) to two pills per day.  Says "part of my problem is even remembering to take my medication on a daily basis."  Says she just forgets to take it.  Patient says while doubling her meds, "I was sweating profusely" and stopped doubling her meds.  Says while doubling her meds, she noticed there was a difference in motivation, "but the sweating side-effect was horrendous."  Says "I've come to the realization that I'm just not going to be your 'get it done' kind of person all the time.  I struggle with that motivational aspect and it is what it is."  Says the Prozac gets her out of bed and gets her moving for the day, but "this powerhouse that I see other people being, that's just not me."  - Exercise Aside from the physical labor of tending for the horses, she is training for a 28 mile hike for the [employer].  They had it planned for last June, and it was cancelled; then it was moved to October, and pushed back again.  It's scheduled again now for this coming June.  She hikes 4-5 miles time to time these days to train, along with a group of people.  - Right Elbow Joint Pain (Lateral Epicondylitis) Since last spring, "I'm experiencing a lot of pain in my right elbow joint."  She says "It's pretty much chronic now, and I can press on the joint and feel pain."  Says "sometimes I feel a heat radiating through my lower arm."  She thinks this may have something to do with shoveling horse manure.  The pain is located on the outside of her elbow. When she goes to pick up a gallon of milk with her right hand, it causes the pain to exacerbate.   "Picking up the creamer out of the refrigerator in the morning for my coffee, it hurts."  The area is not red or swollen.   GAD 7 : Generalized Anxiety Score 03/24/2019 02/23/2018 06/23/2016  Nervous, Anxious, on Edge 0 0 0  Control/stop worrying 0 0 0  Worry too much - different things 0 0 0  Trouble relaxing 0 0 0  Restless 0 0 0  Easily annoyed or irritable 0 0 0  Afraid - awful might happen 0 0 0  Total GAD 7 Score 0 0 0    Depression screen [facility] 2/9 03/24/2019 02/23/2018 05/03/2017 06/23/2016 07/11/2015  Decreased Interest 0 3 1 1  0  Down, Depressed, Hopeless 0 0 1 0 0  PHQ - 2 Score 0 3 2 1  0  Altered sleeping 3 1 2  0 -  Tired, decreased energy 2 3 2  0 -  Change in appetite 0 0 1 0 -  Feeling bad or failure about yourself  0 1 0 0 -  Trouble concentrating 1 3 0 0 -  Moving slowly or fidgety/restless 0 1 0 0 -  Suicidal thoughts 0 0 0 0 -  PHQ-9 Score 6 12 7 1  -  Difficult doing work/chores Not difficult at all Somewhat difficult Somewhat difficult - -      Impression and Recommendations:    1. Adjustment disorder with mixed anxiety and depressed mood-  declines psychology/counseling   2. Lateral epicondylitis of right elbow   3. Health education/counseling      Adjustment Disorder w/ Mixed Anxiety & Depressed Mood - Declines Psychology/counseling - Reviewed patient's symptoms during appointment today. - Education provided and all questions answered.  - Per patient, over the past summer, tried doubling her dose of Prozac on her own. - Advised patient to never double her dose of medications on her own without medical supervision.  - Continue 40 mg Prozac as prescribed.  See med list.  - Encouraged patient to establish with a therapist/counselor.  Patient declined in the past. - Recommended that if patient's symptoms worsen or fail to improve, referral to psychology would be the next step.  Patient knows to let us know if she experiences additional concerns and desires  referral.  - Encouraged patient to try meditation to start her day, to help increase her motivation.  - In addition to prescription intervention and therapy/counseling, reviewed the "spokes of the wheel" of mood and health management.  Stressed the importance of ongoing prudent habits, including regular exercise, appropriate sleep hygiene, healthful dietary habits, and prayer/meditation to relax.  - Will continue to monitor and provide further referral and assistance as requested.  Lateral Epicondylitis of R Elbow - Reviewed patient's symptoms during tele-visit today. - Education provided and all questions answered.  - To alleviate symptoms, advised patient of need to stop the  inflammatory cycle - Advised patient to ice the area of symptoms for 15 minutes, 5-7 times per day. - If possible, advised patient to avoid overuse and repetitive motion of the area. - Told patient she may use NSAID's prudently for pain, such as Advil and alleve.  - Encouraged patient to read handout provided and search trusted sources online for further information.  - If symptoms fail to improve with conservative management, will refer patient to physical therapy and/or Sports Medicine for option of injection.  - Will continue to monitor.  Patient will call with further concerns if needed.  Health Counseling & Preventative Maintenance - Advised patient to continue working toward exercising to improve overall mental, physical, and emotional health.    - Encouraged patient to engage in daily physical activity as tolerated, especially a formal exercise routine.  Recommended that the patient eventually strive for at least 150 minutes of moderate cardiovascular activity per week according to guidelines established by the [facility].   - Health counseling performed.  All questions answered.  Recommendations - Discussed that patient's last blood work was drawn 4 of 2019. - Advised patient to return for CPE and full fasting  lab work near future.   - As part of my medical decision making, I reviewed the following data within the [place] History obtained from pt /family, CMA notes reviewed and incorporated if applicable, Labs reviewed, Radiograph/ tests reviewed if applicable and OV notes from prior OV's with me, as well as other specialists she/he has seen since seeing me last, were all reviewed and used in my medical decision making process today.    - Additionally, discussion had with patient regarding our treatment plan, and their biases/concerns about that plan were used in my medical decision making today.    - The patient agreed with the plan and demonstrated an understanding of the instructions.   No barriers to understanding were identified.    - Red flag symptoms and signs discussed in detail.  Patient expressed understanding regarding what to do in case of emergency\ urgent symptoms.   - The patient was advised to call back or seek an in-person evaluation if the symptoms worsen or if the condition fails to improve as anticipated.   Return in 4 months (on 07/24/2019) for CPE and full fasting lab work .     Meds ordered this encounter  Medications  . FLUoxetine (PROZAC) 40 MG capsule    Sig: TAKE 1 CAPSULE DAILY.    Dispense:  90 capsule    Refill:  0    Medications Discontinued During This Encounter  Medication Reason  . fluticasone (FLONASE) 50 MCG/ACT nasal spray Error  . predniSONE (DELTASONE) 20 MG tablet Error  . FLUoxetine (PROZAC) 40 MG capsule Reorder     Time spent on visit including pre-visit chart review and post-visit care was 13 minutes.  Note:  This note was prepared with assistance of Dragon voice recognition software. Occasional wrong-word or sound-a-like substitutions may have occurred due to the inherent limitations of voice recognition software.   The [place] was signed into law in 2016 which includes the topic of electronic health records.   This provides immediate access to information in MyChart.  This includes consultation notes, operative notes, office notes, lab results and pathology reports.  If you have any questions about what you read please let us know at your next visit or call us at the office.  We are right here with you.  This document serves as a record of services personally performed by Angelica Dance, DO. It was created on her behalf by Toni Amend, a trained medical scribe. The creation of this record is based on the scribe's personal observations and the provider's statements to them.   This case required medical decision making of at least moderate complexity. The above documentation from Toni Amend, medical scribe, has been reviewed by Marjory Sneddon, D.O.   .   __________________________________________________________________________________     Patient Care Team    Relationship Specialty Notifications Start End  Angelica Dance, DO PCP - General Family Medicine  07/11/15      -Vitals obtained; medications/ allergies reconciled;  personal medical, social, Sx etc.histories were updated by CMA, reviewed by me and are reflected in chart   Patient Active Problem List   Diagnosis Date Noted  . Hypertriglyceridemia 11/24/2015  . Adjustment disorder with mixed anxiety and depressed mood 08/29/2015  . Mixed hyperlipidemia 08/29/2015  . History of migraine headaches 11/24/2015  . Vitamin D deficiency 09/10/2015  . Atypical nevi 11/24/2015  . Lateral epicondylitis of right elbow 03/24/2019  . Encounter for wellness examination 05/03/2017  . Papanicolaou smear for cervical cancer screening 05/03/2017  . Travel advice encounter 06/23/2016  . Right bundle branch block 08/29/2015  . Nonspecific chest pain 08/29/2015  . Dyspareunia 05/03/2014  . Frequent headaches 05/03/2014  . Depression 05/03/2014     Current Meds  Medication Sig  . Calcium Carbonate-Vitamin D 600-400  MG-UNIT tablet Take at least 2000 vitamin D per day and at least 1200 of calcium per day  . FLUoxetine (PROZAC) 40 MG capsule TAKE 1 CAPSULE DAILY.  . [DISCONTINUED] FLUoxetine (PROZAC) 40 MG capsule TAKE 1 CAPSULE DAILY. NEED OFFICE VISIT FOR FURTHER   REFILLS     Allergies:  No Known Allergies   ROS:  See above HPI for pertinent positives and negatives   Objective:   Height 5' 0.5" (1.537 m), weight 140 lb (63.5 kg).  (if some vitals are omitted, this means that patient was UNABLE to obtain them even though they were asked to get them prior to OV today.  They were asked to call us at their earliest convenience with these once obtained. )  General: A & O * 3; sounds in no acute distress; in usual state of health.  Skin: Pt confirms warm and dry extremities and pink fingertips HEENT: Pt confirms lips non-cyanotic Chest: Patient confirms normal chest excursion and movement Respiratory: speaking in full sentences, no conversational dyspnea; patient confirms no use of accessory muscles Psych: insight appears good, mood- appears full

## 2019-04-13 ENCOUNTER — Other Ambulatory Visit: Payer: Self-pay | Admitting: Family Medicine

## 2019-04-13 DIAGNOSIS — Z1231 Encounter for screening mammogram for malignant neoplasm of breast: Secondary | ICD-10-CM

## 2019-04-14 ENCOUNTER — Ambulatory Visit
Admission: RE | Admit: 2019-04-14 | Discharge: 2019-04-14 | Disposition: A | Discharge status: Home / Self Care | Payer: BC Managed Care – PPO | Source: Ambulatory Visit | Attending: Family Medicine | Admitting: Family Medicine

## 2019-04-14 DIAGNOSIS — Z1231 Encounter for screening mammogram for malignant neoplasm of breast: Secondary | ICD-10-CM | POA: Insufficient documentation

## 2019-06-07 ENCOUNTER — Other Ambulatory Visit: Payer: Self-pay | Admitting: Family Medicine

## 2019-06-07 DIAGNOSIS — F4323 Adjustment disorder with mixed anxiety and depressed mood: Secondary | ICD-10-CM

## 2019-06-25 NOTE — Progress Notes (Signed)
Established Patient Office Visit  Subjective:  Patient ID: Angelica Knight, female    DOB: 10-18-1960  Age: 59 y.o. MRN: [id number]  CC:  Chief Complaint  Patient presents with  . Hand Pain    HPI Angelica Knight presents for left middle finger pain. Pt reports 02/2019 she got punctured by a rose thorn and shortly after removing thorn her finger was swollen, red and painful x 1 wk. She continues to have pain and has good and bad days. Denies fever, chills, cough, shortness of breath. She does report night sweats which are menopausal related. Pt has done some research and is concerned about a fungal infection.   Past Medical History:  Diagnosis Date  . Back pain   . Chicken pox   . Depression   . Frequent headaches   . Hyperlipidemia   . Neck pain     Past Surgical History:  Procedure Laterality Date  . CESAREAN SECTION    . TONSILLECTOMY    . TUBAL LIGATION      Family History  Problem Relation Age of Onset  . Arthritis Mother   . Cancer Mother        Lung  . Alzheimer's disease Mother   . Cancer Father        prostate  . Hypertension Father   . Diabetes Father   . Hypertension Sister   . Diabetes Sister   . Arthritis Maternal Aunt   . Bipolar disorder Sister   . Arthritis Sister     Social History   Socioeconomic History  . Marital status: Married    Spouse name: Not on file  . Number of children: Not on file  . Years of education: Not on file  . Highest education level: Not on file  Occupational History  . Not on file  Tobacco Use  . Smoking status: Never Smoker  . Smokeless tobacco: Never Used  Substance and Sexual Activity  . Alcohol use: Yes    Alcohol/week: 0.0 standard drinks    Comment: occasional  . Drug use: No  . Sexual activity: Not Currently  Other Topics Concern  . Not on file  Social History Narrative  . Not on file   Social Determinants of Health   Financial Resource Strain:   . Difficulty of Paying Living Expenses:    Food Insecurity:   . Worried About Charity fundraiser in the Last Year:   . Arboriculturist in the Last Year:   Transportation Needs:   . Film/video editor (Medical):   Marland Kitchen Lack of Transportation (Non-Medical):   Physical Activity:   . Days of Exercise per Week:   . Minutes of Exercise per Session:   Stress:   . Feeling of Stress :   Social Connections:   . Frequency of Communication with Friends and Family:   . Frequency of Social Gatherings with Friends and Family:   . Attends Religious Services:   . Active Member of Clubs or Organizations:   . Attends Archivist Meetings:   Marland Kitchen Marital Status:   Intimate Partner Violence:   . Fear of Current or Ex-Partner:   . Emotionally Abused:   Marland Kitchen Physically Abused:   . Sexually Abused:     Outpatient Medications Prior to Visit  Medication Sig Dispense Refill  . Calcium Carbonate-Vitamin D 600-400 MG-UNIT tablet Take at least 2000 vitamin D per day and at least 1200 of calcium per day 60 tablet 11  .  chloroquine (ARALEN) 250 MG tablet Take 2 tablets (500 mg total) by mouth once a week. 14 tablet 0  . FLUoxetine (PROZAC) 40 MG capsule TAKE 1 CAPSULE DAILY. 90 capsule 0   No facility-administered medications prior to visit.    No Known Allergies  ROS Review of Systems  A fourteen system review of systems was performed and found to be positive as per HPI.    Objective:    Physical Exam General: Well nourished, in no apparent distress. Eyes: PERRLA, EOMs, conjunctiva clr Resp: Respiratory effort- normal, ECTA B/L w/o W/R/R  Cardio: RRR w/o MRGs. Abdomen: no gross distention. Lymphatics:  less 2 sec cap RF M-sk: Full ROM, good strength, normal gait. Tenderness to deep palpation of left middle finger near PIP joint.  Skin: Warm, dry. No gross rash present. Small, erythematous skin nodule of left middle finger without drainage and warmth present. Neuro: Alert, Oriented Psych: Normal affect, Insight and Judgment  appropriate.   BP 125/81   Pulse 71   Temp (!) 97.4 F (36.3 C) (Oral)   Ht 5' 0.5" (1.537 m)   Wt 139 lb 14.4 oz (63.5 kg)   SpO2 98%   BMI 26.87 kg/m  Wt Readings from Last 3 Encounters:  06/26/19 139 lb 14.4 oz (63.5 kg)  03/24/19 140 lb (63.5 kg)  02/23/18 140 lb 14.4 oz (63.9 kg)     There are no preventive care reminders to display for this patient.  There are no preventive care reminders to display for this patient.  Lab Results  Component Value Date   TSH 2.340 05/03/2017   Lab Results  Component Value Date   WBC 5.5 05/03/2017   HGB 13.2 05/03/2017   HCT 40.9 05/03/2017   MCV 92 05/03/2017   PLT 325 05/03/2017   Lab Results  Component Value Date   NA 142 06/26/2019   K 4.2 06/26/2019   CO2 24 06/26/2019   GLUCOSE 79 06/26/2019   BUN 16 06/26/2019   CREATININE 0.73 06/26/2019   BILITOT <0.2 06/26/2019   ALKPHOS 73 06/26/2019   AST 26 06/26/2019   ALT 23 06/26/2019   PROT 6.3 06/26/2019   ALBUMIN 4.2 06/26/2019   CALCIUM 9.1 06/26/2019   GFR 93.78 06/12/2014   Lab Results  Component Value Date   CHOL 236 (H) 05/03/2017   Lab Results  Component Value Date   HDL 55 05/03/2017   Lab Results  Component Value Date   LDLCALC 150 (H) 05/03/2017   Lab Results  Component Value Date   TRIG 156 (H) 05/03/2017   Lab Results  Component Value Date   CHOLHDL 4.3 05/03/2017   Lab Results  Component Value Date   HGBA1C 5.6 05/03/2017      Assessment & Plan:   Problem List Items Addressed This Visit    None    Visit Diagnoses    Cutaneous sporotrichosis    -  Primary   Relevant Medications   Itraconazole 200 MG TABS   Medication monitoring encounter       Relevant Orders   Comp Met (CMET) (Completed)     Cutaneous sporotrichosis: - Pt's mechanism of injury and long-lasting pain is suggestive of cutaneous sporotrichosis so will start appropriate treatment- Itraconazole 200 mg. - Pt has been training for a long hike which is this  weekend, so advised to start antifungal medication until Sunday. - Will order CMP to get baseline liver and renal function. - Follow-up in 4 weeks to reassess symptoms and  medication therapy.  Meds ordered this encounter  Medications  . Itraconazole 200 MG TABS    Sig: Take 1 tablet by mouth with food once daily x 4 weeks.    Dispense:  30 tablet    Refill:  0    Order Specific Question:   Supervising Provider    Answer:   Beatrice Lecher D [[id number]    Follow-up: Return in about 4 weeks (around 07/24/2019) for recheck cutaneous fungal infx.    Lorrene Reid, PA-C

## 2019-06-26 ENCOUNTER — Other Ambulatory Visit: Payer: Self-pay

## 2019-06-26 ENCOUNTER — Encounter: Payer: Self-pay | Admitting: Physician Assistant

## 2019-06-26 ENCOUNTER — Ambulatory Visit (INDEPENDENT_AMBULATORY_CARE_PROVIDER_SITE_OTHER): Payer: BC Managed Care – PPO | Admitting: Physician Assistant

## 2019-06-26 VITALS — BP 125/81 | HR 71 | Temp 97.4°F | Ht 60.5 in | Wt 139.9 lb

## 2019-06-26 DIAGNOSIS — Z5181 Encounter for therapeutic drug level monitoring: Secondary | ICD-10-CM

## 2019-06-26 DIAGNOSIS — B429 Sporotrichosis, unspecified: Secondary | ICD-10-CM

## 2019-06-26 DIAGNOSIS — B4289 Other forms of sporotrichosis: Secondary | ICD-10-CM

## 2019-06-26 MED ORDER — ITRACONAZOLE 200 MG PO TABS: ORAL_TABLET | ORAL | 0 refills | Status: DC

## 2019-06-27 LAB — COMPREHENSIVE METABOLIC PANEL
ALT: 23 IU/L (ref 0–32)
AST: 26 IU/L (ref 0–40)
Albumin/Globulin Ratio: 2 (ref 1.2–2.2)
Albumin: 4.2 g/dL (ref 3.8–4.9)
Alkaline Phosphatase: 73 IU/L (ref 48–121)
BUN/Creatinine Ratio: 22 (ref 12–28)
BUN: 16 mg/dL (ref 8–27)
Bilirubin Total: <0.2 mg/dL (ref 0.0–1.2)
CO2: 24 mmol/L (ref 20–29)
Calcium: 9.1 mg/dL (ref 8.7–10.3)
Chloride: 106 mmol/L (ref 96–106)
Creatinine, Ser: 0.73 mg/dL (ref 0.57–1.00)
GFR calc Af Amer: 104 mL/min/{1.73_m2} (ref >59)
GFR calc non Af Amer: 90 mL/min/{1.73_m2} (ref >59)
Globulin, Total: 2.1 g/dL (ref 1.5–4.5)
Glucose: 79 mg/dL (ref 65–99)
Potassium: 4.2 mmol/L (ref 3.5–5.2)
Sodium: 142 mmol/L (ref 134–144)
Total Protein: 6.3 g/dL (ref 6.0–8.5)

## 2019-08-01 ENCOUNTER — Encounter: Payer: Self-pay | Admitting: Physician Assistant

## 2019-08-01 ENCOUNTER — Other Ambulatory Visit: Payer: Self-pay

## 2019-08-01 ENCOUNTER — Ambulatory Visit (INDEPENDENT_AMBULATORY_CARE_PROVIDER_SITE_OTHER): Payer: BC Managed Care – PPO | Admitting: Physician Assistant

## 2019-08-01 VITALS — BP 97/59 | HR 63 | Temp 98.5°F | Ht 60.75 in | Wt 128.7 lb

## 2019-08-01 DIAGNOSIS — E559 Vitamin D deficiency, unspecified: Secondary | ICD-10-CM

## 2019-08-01 DIAGNOSIS — E782 Mixed hyperlipidemia: Secondary | ICD-10-CM

## 2019-08-01 DIAGNOSIS — E781 Pure hyperglyceridemia: Secondary | ICD-10-CM | POA: Diagnosis not present

## 2019-08-01 DIAGNOSIS — H6593 Unspecified nonsuppurative otitis media, bilateral: Secondary | ICD-10-CM

## 2019-08-01 DIAGNOSIS — Z23 Encounter for immunization: Secondary | ICD-10-CM

## 2019-08-01 DIAGNOSIS — Z0000 Encounter for general adult medical examination without abnormal findings: Secondary | ICD-10-CM | POA: Diagnosis not present

## 2019-08-01 DIAGNOSIS — B4289 Other forms of sporotrichosis: Secondary | ICD-10-CM

## 2019-08-01 DIAGNOSIS — B429 Sporotrichosis, unspecified: Secondary | ICD-10-CM

## 2019-08-01 NOTE — Progress Notes (Signed)
Female Physical   Impression and Recommendations:    1. Mixed hyperlipidemia   2. Vitamin D deficiency   3. Hypertriglyceridemia   4. Encounter for wellness examination   5. Need for shingles vaccine      1) Anticipatory Guidance: Discussed skin CA prevention and sunscreen when outside along with skin surveillance; eating a balanced and modest diet; physical activity at least 25 minutes per day or minimum of 150 min/ week moderate to intense activity.  2) Immunizations / Screenings / Labs:   All immunizations are up-to-date per recommendations or will be updated today if pt allows.    - Patient understands with dental and vision screens they will schedule independently.  - Will obtain CBC, CMP, HgA1c, Lipid panel, TSH and vit D when fasting, if not already done past 12 mo/ recently  -UTD on mammogram, Pap smear, Tdap -Placed orders for Shingrix vaccine, hep C and HIV screenings  3) Weight:  BMI meaning discussed with patient.  Discussed goal to improve diet habits to improve overall feelings of well being and objective health data. Improve nutrient density of diet through increasing intake of fruits and vegetables and decreasing saturated fats, white flour products and refined sugars.  4) Healthcare Maintenance: -Continue current medication regimen. Will contact pharmacy to inquire about Itraconazole 200 mg. Per patient, pharmacy didn't have specific dosage and were going to contact the office, but haven't received any notification. -Follow heart healthy diet and continue to stay as active as possible. -Stay well hydrated, at least 64 fl oz -Follow up in 4 months for regular OV: Mood, HLD, 2nd shingrix   No orders of the defined types were placed in this encounter.   Orders Placed This Encounter  Procedures  . Varicella-zoster vaccine IM (Shingrix)  . CBC with Differential/Platelet  . Comprehensive metabolic panel  . Hemoglobin A1c  . Lipid panel  . TSH  . VITAMIN D 25  Hydroxy (Vit-D Deficiency, Fractures)     No follow-ups on file.    Gross side effects, risk and benefits, and alternatives of medications discussed with patient.  Patient is aware that all medications have potential side effects and we are unable to predict every side effect or drug-drug interaction that may occur.  Expresses verbal understanding and consents to current therapy plan and treatment regimen.  F-up preventative CPE in 1 year- this is in addition to any chronic care visits.    Please see orders placed and AVS handed out to patient at the end of our visit for further patient instructions/ counseling done pertaining to today's office visit.  Note:  This note was prepared with assistance of Dragon voice recognition software. Occasional wrong-word or sound-a-like substitutions may have occurred due to the inherent limitations of voice recognition software.    Subjective:     CPE HPI: Angelica Knight is a 59 y.o. female who presents to [place] [facility] today for a yearly health maintenance exam.   Health Maintenance Summary  - Reviewed and updated, unless pt declines services.  Last Cologuard or Colonoscopy: unknown, she had colonoscopy in [place], states was normal Family history of Colon CA: No Tobacco History Reviewed:  Y, never smoker  Alcohol and/or drug use: No concerns; no use Dental Home: Y Eye exams: Y Dermatology home: N Female Health:  PAP Smear - last known results: 05/03/2017- negative STD concerns:  none Lumps or breast concerns: none Breast Cancer Family History: No     Immunization History  Administered Date(s) Administered  . Hepatitis A, Adult 06/23/2016  . Tdap 07/11/2015     Health Maintenance  Topic Date Due  . INFLUENZA VACCINE  08/20/2019  . COLONOSCOPY  01/20/2020  . PAP SMEAR-Modifier  05/03/2020  . MAMMOGRAM  04/13/2021  . TETANUS/TDAP  07/10/2025  . Hepatitis C Screening  Completed  . HIV Screening   Completed     Wt Readings from Last 3 Encounters:  08/01/19 128 lb 11.2 oz (58.4 kg)  06/26/19 139 lb 14.4 oz (63.5 kg)  03/24/19 140 lb (63.5 kg)   BP Readings from Last 3 Encounters:  08/01/19 (!) 97/59  06/26/19 125/81  02/23/18 127/84   Pulse Readings from Last 3 Encounters:  08/01/19 63  06/26/19 71  02/23/18 60     Past Medical History:  Diagnosis Date  . Back pain   . Chicken pox   . Depression   . Frequent headaches   . Hyperlipidemia   . Neck pain       Past Surgical History:  Procedure Laterality Date  . CESAREAN SECTION    . TONSILLECTOMY    . TUBAL LIGATION        Family History  Problem Relation Age of Onset  . Arthritis Mother   . Cancer Mother        Lung  . Alzheimer's disease Mother   . Cancer Father        prostate  . Hypertension Father   . Diabetes Father   . Hypertension Sister   . Diabetes Sister   . Arthritis Maternal Aunt   . Bipolar disorder Sister   . Arthritis Sister       Social History   Substance and Sexual Activity  Drug Use No  ,   Social History   Substance and Sexual Activity  Alcohol Use Yes  . Alcohol/week: 0.0 standard drinks   Comment: occasional  ,   Social History   Tobacco Use  Smoking Status Never Smoker  Smokeless Tobacco Never Used  ,   Social History   Substance and Sexual Activity  Sexual Activity Not Currently    Current Outpatient Medications on File Prior to Visit  Medication Sig Dispense Refill  . Calcium Carbonate-Vitamin D 600-400 MG-UNIT tablet Take at least 2000 vitamin D per day and at least 1200 of calcium per day 60 tablet 11  . chloroquine (ARALEN) 250 MG tablet Take 2 tablets (500 mg total) by mouth once a week. 14 tablet 0  . FLUoxetine (PROZAC) 40 MG capsule TAKE 1 CAPSULE DAILY. 90 capsule 0  . Itraconazole 200 MG TABS Take 1 tablet by mouth with food once daily x 4 weeks. (Patient not taking: Reported on 08/01/2019) 30 tablet 0   No current facility-administered  medications on file prior to visit.    Allergies: Patient has no known allergies.  Review of Systems: General:   Denies fever, chills, unexplained weight loss.  Optho/Auditory:   Denies visual changes, blurred vision/LOV Respiratory:   Denies SOB, DOE more than baseline levels.   Cardiovascular:   Denies chest pain, palpitations, new onset peripheral edema  Gastrointestinal:   Denies nausea, vomiting, diarrhea.  Genitourinary: Denies dysuria, freq/ urgency, flank pain or vaginal discharge  Endocrine:     Denies hot or cold intolerance, polyuria, polydipsia. Musculoskeletal:   Denies unexplained myalgias, joint swelling, unexplained arthralgias, gait problems.  Skin:  Denies rash, suspicious lesions Neurological:     Denies dizziness, unexplained weakness, numbness  Psychiatric/Behavioral:   Denies  mood changes, suicidal or homicidal ideations, hallucinations    Objective:    Blood pressure (!) 97/59, pulse 63, temperature 98.5 F (36.9 C), temperature source Oral, height 5' 0.75" (1.543 m), weight 128 lb 11.2 oz (58.4 kg), SpO2 99 %. Body mass index is 24.52 kg/m. General Appearance:    Alert, cooperative, no distress, appears stated age  Head:    Normocephalic, without obvious abnormality, atraumatic  Eyes:    PERRL, conjunctiva/corneas clear, EOM's intact, both eyes  Ears:    Middle ear effusion, both ears  Nose:   Nares normal, septum midline, mucosa normal, no drainage    or sinus tenderness  Throat:   Lips w/o lesion, mucosa moist, and tongue normal; teeth and   gums normal  Neck:   Supple, symmetrical, trachea midline, no adenopathy;    thyroid:  no enlargement/tenderness/nodules; no carotid   bruit or JVD  Back:     Symmetric, no curvature, ROM normal, no CVA tenderness  Lungs:     Clear to auscultation bilaterally, respirations unlabored, no   Wh/ R/ R  Chest Wall:    No tenderness or gross deformity; normal excursion   Heart:    Regular rate and rhythm, S1 and S2  normal, no murmur, rub   or gallop  Breast Exam:    Deferred. No concerns, normal mammogram 03/2019  Abdomen:     Soft, non-tender, bowel sounds active all four quadrants, No   G/R/R, no masses, no organomegaly  Genitalia:    Deferred.  Rectal:    Deferred.  Extremities:   Extremities normal, atraumatic, no cyanosis or gross edema  Pulses:   Normal   Skin:   Warm, dry, Skin color, texture, turgor normal, no obvious rashes or lesions Psych: No HI/SI, judgement and insight good, Euthymic mood. Full Affect.  Neurologic:   CNII-XII grossly intact, normal strength, sensation and reflexes    Throughout

## 2019-08-01 NOTE — Patient Instructions (Signed)

## 2019-08-02 LAB — COMPREHENSIVE METABOLIC PANEL
ALT: 39 IU/L — ABNORMAL HIGH (ref 0–32)
AST: 40 IU/L (ref 0–40)
Albumin/Globulin Ratio: 2.6 — ABNORMAL HIGH (ref 1.2–2.2)
Albumin: 4.6 g/dL (ref 3.8–4.9)
Alkaline Phosphatase: 77 IU/L (ref 48–121)
BUN/Creatinine Ratio: 25 (ref 12–28)
BUN: 19 mg/dL (ref 8–27)
Bilirubin Total: <0.2 mg/dL (ref 0.0–1.2)
CO2: 27 mmol/L (ref 20–29)
Calcium: 9.4 mg/dL (ref 8.7–10.3)
Chloride: 104 mmol/L (ref 96–106)
Creatinine, Ser: 0.76 mg/dL (ref 0.57–1.00)
GFR calc Af Amer: 99 mL/min/{1.73_m2} (ref >59)
GFR calc non Af Amer: 86 mL/min/{1.73_m2} (ref >59)
Globulin, Total: 1.8 g/dL (ref 1.5–4.5)
Glucose: 90 mg/dL (ref 65–99)
Potassium: 5.6 mmol/L — ABNORMAL HIGH (ref 3.5–5.2)
Sodium: 141 mmol/L (ref 134–144)
Total Protein: 6.4 g/dL (ref 6.0–8.5)

## 2019-08-02 LAB — HEMOGLOBIN A1C
Est. average glucose Bld gHb Est-mCnc: 108 mg/dL
Hgb A1c MFr Bld: 5.4 % (ref 4.8–5.6)

## 2019-08-02 LAB — LIPID PANEL
Chol/HDL Ratio: 3.2 ratio (ref 0.0–4.4)
Cholesterol, Total: 155 mg/dL (ref 100–199)
HDL: 48 mg/dL (ref >39)
LDL Chol Calc (NIH): 93 mg/dL (ref 0–99)
Triglycerides: 71 mg/dL (ref 0–149)
VLDL Cholesterol Cal: 14 mg/dL (ref 5–40)

## 2019-08-02 LAB — CBC WITH DIFFERENTIAL/PLATELET
Basophils Absolute: 0 10*3/uL (ref 0.0–0.2)
Basos: 0 %
EOS (ABSOLUTE): 0.2 10*3/uL (ref 0.0–0.4)
Eos: 4 %
Hematocrit: 40.7 % (ref 34.0–46.6)
Hemoglobin: 13.4 g/dL (ref 11.1–15.9)
Immature Grans (Abs): 0 10*3/uL (ref 0.0–0.1)
Immature Granulocytes: 0 %
Lymphocytes Absolute: 1.5 10*3/uL (ref 0.7–3.1)
Lymphs: 34 %
MCH: 29.1 pg (ref 26.6–33.0)
MCHC: 32.9 g/dL (ref 31.5–35.7)
MCV: 89 fL (ref 79–97)
Monocytes Absolute: 0.4 10*3/uL (ref 0.1–0.9)
Monocytes: 8 %
Neutrophils Absolute: 2.5 10*3/uL (ref 1.4–7.0)
Neutrophils: 54 %
Platelets: 275 10*3/uL (ref 150–450)
RBC: 4.6 x10E6/uL (ref 3.77–5.28)
RDW: 12.3 % (ref 11.7–15.4)
WBC: 4.5 10*3/uL (ref 3.4–10.8)

## 2019-08-02 LAB — VITAMIN D 25 HYDROXY (VIT D DEFICIENCY, FRACTURES): Vit D, 25-Hydroxy: 72.1 ng/mL (ref 30.0–100.0)

## 2019-08-02 LAB — TSH: TSH: 2.14 u[IU]/mL (ref 0.450–4.500)

## 2019-08-02 MED ORDER — ITRACONAZOLE 100 MG PO CAPS: ORAL_CAPSULE | ORAL | 0 refills | Status: DC

## 2019-08-25 ENCOUNTER — Other Ambulatory Visit: Payer: Self-pay | Admitting: Family Medicine

## 2019-08-25 DIAGNOSIS — F4323 Adjustment disorder with mixed anxiety and depressed mood: Secondary | ICD-10-CM

## 2019-08-28 MED ORDER — FLUOXETINE HCL 40 MG PO CAPS: ORAL_CAPSULE | ORAL | 0 refills | Status: DC

## 2019-09-11 ENCOUNTER — Other Ambulatory Visit: Payer: Self-pay

## 2019-09-11 ENCOUNTER — Other Ambulatory Visit: Payer: Self-pay | Admitting: Physician Assistant

## 2019-09-11 ENCOUNTER — Other Ambulatory Visit: Payer: BC Managed Care – PPO

## 2019-09-11 DIAGNOSIS — R899 Unspecified abnormal finding in specimens from other organs, systems and tissues: Secondary | ICD-10-CM | POA: Diagnosis not present

## 2019-09-12 LAB — HEPATIC FUNCTION PANEL
ALT: 47 IU/L — ABNORMAL HIGH (ref 0–32)
AST: 42 IU/L — ABNORMAL HIGH (ref 0–40)
Albumin: 4.6 g/dL (ref 3.8–4.9)
Alkaline Phosphatase: 104 IU/L (ref 48–121)
Bilirubin Total: 0.4 mg/dL (ref 0.0–1.2)
Bilirubin, Direct: 0.14 mg/dL (ref 0.00–0.40)
Total Protein: 6.5 g/dL (ref 6.0–8.5)

## 2019-09-17 ENCOUNTER — Encounter (HOSPITAL_COMMUNITY): Payer: Self-pay | Admitting: Emergency Medicine

## 2019-09-17 ENCOUNTER — Ambulatory Visit (INDEPENDENT_AMBULATORY_CARE_PROVIDER_SITE_OTHER): Payer: BC Managed Care – PPO

## 2019-09-17 ENCOUNTER — Ambulatory Visit (HOSPITAL_COMMUNITY)
Admission: EM | Admit: 2019-09-17 | Discharge: 2019-09-17 | Disposition: A | Discharge status: Home / Self Care | Payer: BC Managed Care – PPO | Attending: Urgent Care | Admitting: Urgent Care

## 2019-09-17 ENCOUNTER — Other Ambulatory Visit: Payer: Self-pay

## 2019-09-17 DIAGNOSIS — M79642 Pain in left hand: Secondary | ICD-10-CM

## 2019-09-17 DIAGNOSIS — S6992XA Unspecified injury of left wrist, hand and finger(s), initial encounter: Secondary | ICD-10-CM | POA: Diagnosis not present

## 2019-09-17 DIAGNOSIS — S60222A Contusion of left hand, initial encounter: Secondary | ICD-10-CM | POA: Diagnosis not present

## 2019-09-17 DIAGNOSIS — W19XXXA Unspecified fall, initial encounter: Secondary | ICD-10-CM

## 2019-09-17 MED ORDER — NAPROXEN 375 MG PO TABS: 375.0000 mg | ORAL_TABLET | Freq: Two times a day (BID) | ORAL | 0 refills | Status: DC

## 2019-09-17 MED ORDER — ACETAMINOPHEN 325 MG PO TABS: 650.0000 mg | ORAL_TABLET | Freq: Once | ORAL | Status: DC

## 2019-09-17 NOTE — Discharge Instructions (Addendum)
Please just use Tylenol at a dose of 500mg-650mg once every 6 hours as needed for your aches, pains, fevers. Do not use any nonsteroidal anti-inflammatories (NSAIDs) like ibuprofen, Motrin, naproxen, Aleve, etc. which are all available over-the-counter.   

## 2019-09-17 NOTE — ED Triage Notes (Signed)
Pt c/o fall yesterday around 3 pm. Pt states she fell in the barn on concrete. She put her hand out to catch herself. Pt has been wearing a brace.

## 2019-09-17 NOTE — ED Provider Notes (Signed)
[facility]   MRN: [id number] DOB: 08-Apr-1960  Subjective:   Angelica Knight is a 59 y.o. female presenting for accidental fall yesterday.  Patient was in the barn and tripped, fell on an outstretched left hand where she observed most of the pain.  Has had pain along the base of her right thumb and palm of her hand.  Has been wearing a brace.  No medications taken orally.  No current facility-administered medications for this encounter.  Current Outpatient Medications:  .  Calcium Carbonate-Vitamin D 600-400 MG-UNIT tablet, Take at least 2000 vitamin D per day and at least 1200 of calcium per day, Disp: 60 tablet, Rfl: 11 .  chloroquine (ARALEN) 250 MG tablet, Take 2 tablets (500 mg total) by mouth once a week., Disp: 14 tablet, Rfl: 0 .  FLUoxetine (PROZAC) 40 MG capsule, TAKE 1 CAPSULE DAILY., Disp: 90 capsule, Rfl: 0 .  itraconazole (SPORANOX) 100 MG capsule, Take 2 capsules by mouth with food once daily x 4 weeks., Disp: 60 capsule, Rfl: 0   No Known Allergies  Past Medical History:  Diagnosis Date  . Back pain   . Chicken pox   . Depression   . Frequent headaches   . Hyperlipidemia   . Neck pain      Past Surgical History:  Procedure Laterality Date  . CESAREAN SECTION    . TONSILLECTOMY    . TUBAL LIGATION      Family History  Problem Relation Age of Onset  . Arthritis Mother   . Cancer Mother        Lung  . Alzheimer's disease Mother   . Cancer Father        prostate  . Hypertension Father   . Diabetes Father   . Hypertension Sister   . Diabetes Sister   . Arthritis Maternal Aunt   . Bipolar disorder Sister   . Arthritis Sister     Social History   Tobacco Use  . Smoking status: Never Smoker  . Smokeless tobacco: Never Used  Vaping Use  . Vaping Use: Never used  Substance Use Topics  . Alcohol use: Yes    Alcohol/week: 0.0 standard drinks    Comment: occasional  . Drug use: No    ROS   Objective:   Vitals: BP 115/69 (BP  Location: Left Arm)   Pulse 65   Temp 98.6 F (37 C) (Oral)   Resp 16   SpO2 100%   Physical Exam Constitutional:      General: She is not in acute distress.    Appearance: Normal appearance. She is well-developed. She is not ill-appearing, toxic-appearing or diaphoretic.  HENT:     Head: Normocephalic and atraumatic.     Nose: Nose normal.     Mouth/Throat:     Mouth: Mucous membranes are moist.     Pharynx: Oropharynx is clear.  Eyes:     General: No scleral icterus.       Right eye: No discharge.        Left eye: No discharge.     Extraocular Movements: Extraocular movements intact.     Conjunctiva/sclera: Conjunctivae normal.     Pupils: Pupils are equal, round, and reactive to light.  Cardiovascular:     Rate and Rhythm: Normal rate.  Pulmonary:     Effort: Pulmonary effort is normal.  Musculoskeletal:     Left hand: Swelling and tenderness present. No deformity, lacerations or bony tenderness. Decreased range of motion. Normal  strength. Normal sensation. There is no disruption of two-point discrimination. Normal capillary refill.       Hands:  Skin:    General: Skin is warm and dry.  Neurological:     General: No focal deficit present.     Mental Status: She is alert and oriented to person, place, and time.     Motor: No weakness.     Coordination: Coordination normal.     Gait: Gait normal.     Deep Tendon Reflexes: Reflexes normal.  Psychiatric:        Mood and Affect: Mood normal.        Behavior: Behavior normal.        Thought Content: Thought content normal.        Judgment: Judgment normal.     DG Hand Complete Left  Result Date: 09/17/2019 CLINICAL DATA:  Left hand injury.  Fall.  Pain at base of thumb. EXAM: LEFT HAND - COMPLETE 3+ VIEW COMPARISON:  None. FINDINGS: No acute fractures identified. Widening of the scapholunate interval measuring at least 4 mm. No other abnormalities. IMPRESSION: Widening of the scapholunate interval to at least 4 mm  suggest a scapholunate ligament tear. The finding is age indeterminate. No acute fractures. Electronically Signed   By: Dorise Bullion III M.D   On: 09/17/2019 17:14     Assessment and Plan :   PDMP not reviewed this encounter.  1. Contusion of left hand, initial encounter   2. Left hand pain   3. Fall, initial encounter     Will use conservative management for hand contusion.  Patient had no tenderness over area of separation between scaphoid and lunate.  Reviewed images with patient and recommended she follow-up with a hand specialist if symptoms persist or if she wants a general consultation.  Otherwise use an Ace wrap, icing, naproxen for pain and inflammation. Counseled patient on potential for adverse effects with medications prescribed/recommended today, ER and return-to-clinic precautions discussed, patient verbalized understanding.    Jaynee Eagles, PA-C 09/17/19 1730

## 2019-09-17 NOTE — ED Notes (Signed)
Tylenol not given

## 2019-10-26 ENCOUNTER — Other Ambulatory Visit: Payer: Self-pay | Admitting: Physician Assistant

## 2019-10-26 ENCOUNTER — Other Ambulatory Visit (INDEPENDENT_AMBULATORY_CARE_PROVIDER_SITE_OTHER): Payer: BC Managed Care – PPO

## 2019-10-26 ENCOUNTER — Other Ambulatory Visit: Payer: Self-pay

## 2019-10-26 VITALS — BP 124/72 | HR 64 | Ht 60.5 in | Wt 123.3 lb

## 2019-10-26 DIAGNOSIS — Z23 Encounter for immunization: Secondary | ICD-10-CM

## 2019-10-26 DIAGNOSIS — E782 Mixed hyperlipidemia: Secondary | ICD-10-CM

## 2019-10-26 DIAGNOSIS — Z0000 Encounter for general adult medical examination without abnormal findings: Secondary | ICD-10-CM

## 2019-10-26 NOTE — Progress Notes (Signed)
Patient in office for labs and shingrix vaccine. Patient fever free. Patient tolerated vaccine well. VIS given. AS, CMA

## 2019-10-27 LAB — HEPATIC FUNCTION PANEL
ALT: 64 IU/L — ABNORMAL HIGH (ref 0–32)
AST: 39 IU/L (ref 0–40)
Albumin: 4.6 g/dL (ref 3.8–4.9)
Alkaline Phosphatase: 103 IU/L (ref 44–121)
Bilirubin Total: 0.4 mg/dL (ref 0.0–1.2)
Bilirubin, Direct: 0.1 mg/dL (ref 0.00–0.40)
Total Protein: 6.9 g/dL (ref 6.0–8.5)

## 2020-02-29 ENCOUNTER — Other Ambulatory Visit: Payer: Self-pay | Admitting: Physician Assistant

## 2020-03-11 ENCOUNTER — Other Ambulatory Visit: Payer: Self-pay | Admitting: Physician Assistant

## 2020-03-11 DIAGNOSIS — Z1231 Encounter for screening mammogram for malignant neoplasm of breast: Secondary | ICD-10-CM

## 2020-03-15 ENCOUNTER — Telehealth: Payer: Self-pay | Admitting: Physician Assistant

## 2020-03-15 DIAGNOSIS — F4323 Adjustment disorder with mixed anxiety and depressed mood: Secondary | ICD-10-CM

## 2020-03-15 MED ORDER — FLUOXETINE HCL 40 MG PO CAPS: 40.0000 mg | ORAL_CAPSULE | Freq: Every day | ORAL | 0 refills | Status: DC

## 2020-03-15 NOTE — Telephone Encounter (Signed)
60 day supply of meds sent in per refill protocol.  Please contact patient to schedule OV per last AVS for further med refills. AS, CMA

## 2020-04-11 ENCOUNTER — Encounter: Payer: Self-pay | Admitting: Physician Assistant

## 2020-04-11 ENCOUNTER — Other Ambulatory Visit: Payer: Self-pay

## 2020-04-11 ENCOUNTER — Ambulatory Visit (INDEPENDENT_AMBULATORY_CARE_PROVIDER_SITE_OTHER): Payer: 59 | Admitting: Physician Assistant

## 2020-04-11 VITALS — BP 121/78 | HR 59 | Temp 97.0°F | Ht 61.0 in | Wt 126.7 lb

## 2020-04-11 DIAGNOSIS — Z1211 Encounter for screening for malignant neoplasm of colon: Secondary | ICD-10-CM | POA: Diagnosis not present

## 2020-04-11 DIAGNOSIS — M79645 Pain in left finger(s): Secondary | ICD-10-CM | POA: Diagnosis not present

## 2020-04-11 DIAGNOSIS — F4323 Adjustment disorder with mixed anxiety and depressed mood: Secondary | ICD-10-CM | POA: Diagnosis not present

## 2020-04-11 DIAGNOSIS — M25442 Effusion, left hand: Secondary | ICD-10-CM | POA: Diagnosis not present

## 2020-04-11 MED ORDER — FLUOXETINE HCL 40 MG PO CAPS: 40.0000 mg | ORAL_CAPSULE | Freq: Every day | ORAL | 1 refills | Status: DC

## 2020-04-11 NOTE — Progress Notes (Signed)
Established Patient Office Visit  Subjective:  Patient ID: Angelica Knight, female    DOB: [birth year]  Age: 60 y.o. MRN: [id number]  CC:  Chief Complaint  Patient presents with  . Follow-up  . Medication Refill    HPI REA KALAMA presents for follow up on mood management. Reports taking medication without issues. Has been on medication for quite some time. In the past tried other medications and weaning off medication which did not work well. Denies mood changes or SI/HI. Has c/o tender and swollen joint of left pinky x several months. Today is the first time she has noticed redness. Denies injury/trauma, fever or chills. No FHx of autoimmune conditions.   Past Medical History:  Diagnosis Date  . Back pain   . Chicken pox   . Depression   . Frequent headaches   . Hyperlipidemia   . Neck pain     Past Surgical History:  Procedure Laterality Date  . CESAREAN SECTION    . TONSILLECTOMY    . TUBAL LIGATION      Family History  Problem Relation Age of Onset  . Arthritis Mother   . Cancer Mother        Lung  . Alzheimer's disease Mother   . Cancer Father        prostate  . Hypertension Father   . Diabetes Father   . Hypertension Sister   . Diabetes Sister   . Arthritis Maternal Aunt   . Bipolar disorder Sister   . Arthritis Sister     Social History   Socioeconomic History  . Marital status: Married    Spouse name: Not on file  . Number of children: Not on file  . Years of education: Not on file  . Highest education level: Not on file  Occupational History  . Not on file  Tobacco Use  . Smoking status: Never Smoker  . Smokeless tobacco: Never Used  Vaping Use  . Vaping Use: Never used  Substance and Sexual Activity  . Alcohol use: Yes    Alcohol/week: 0.0 standard drinks    Comment: occasional  . Drug use: No  . Sexual activity: Not Currently  Other Topics Concern  . Not on file  Social History Narrative  . Not on file   Social  Determinants of Health   Financial Resource Strain: Not on file  Food Insecurity: Not on file  Transportation Needs: Not on file  Physical Activity: Not on file  Stress: Not on file  Social Connections: Not on file  Intimate Partner Violence: Not on file    Outpatient Medications Prior to Visit  Medication Sig Dispense Refill  . Calcium Carbonate-Vitamin D 600-400 MG-UNIT tablet Take at least 2000 vitamin D per day and at least 1200 of calcium per day 60 tablet 11  . FLUoxetine (PROZAC) 40 MG capsule Take 1 capsule (40 mg total) by mouth daily. **NEEDS APT FOR REFILLS** 60 capsule 0  . chloroquine (ARALEN) 250 MG tablet Take 2 tablets (500 mg total) by mouth once a week. 14 tablet 0  . itraconazole (SPORANOX) 100 MG capsule Take 2 capsules by mouth with food once daily x 4 weeks. 60 capsule 0  . naproxen (NAPROSYN) 375 MG tablet Take 1 tablet (375 mg total) by mouth 2 (two) times daily with a meal. 30 tablet 0   No facility-administered medications prior to visit.    No Known Allergies  ROS Review of Systems A fourteen system review of  systems was performed and found to be positive as per HPI.    Objective:    Physical Exam General:  Well Developed, well nourished, in no acute distress. Neuro:  Alert and oriented,  extra-ocular muscles intact  HEENT:  Normocephalic, atraumatic, neck supple  Skin:  no gross rash. Cardiac:  RRR, S1 S2 Respiratory:  ECTA B/L and A/P, Not using accessory muscles, speaking in full sentences- unlabored. MSK: Erythema, tenderness and swelling of PIP joint (left pinky). Good ROM and strength. DIP and PIP thickening of hand joints noted. Vascular:  Ext warm, no cyanosis apprec.; cap RF less 2 sec. Psych:  No HI/SI, judgement and insight good, Euthymic mood. Full Affect.  BP 121/78   Pulse (!) 59   Temp (!) 97 F (36.1 C)   Ht '5\' 1"'  (1.549 m)   Wt 126 lb 11.2 oz (57.5 kg)   SpO2 99%   BMI 23.94 kg/m  Wt Readings from Last 3 Encounters:   04/11/20 126 lb 11.2 oz (57.5 kg)  10/26/19 123 lb 4.8 oz (55.9 kg)  08/01/19 128 lb 11.2 oz (58.4 kg)     Health Maintenance Due  Topic Date Due  . COLONOSCOPY (Pts 45-48yr Insurance coverage will need to be confirmed)  01/20/2020  . PAP SMEAR-Modifier  05/03/2020    There are no preventive care reminders to display for this patient.  Lab Results  Component Value Date   TSH 2.140 08/01/2019   Lab Results  Component Value Date   WBC 4.5 08/01/2019   HGB 13.4 08/01/2019   HCT 40.7 08/01/2019   MCV 89 08/01/2019   PLT 275 08/01/2019   Lab Results  Component Value Date   NA 141 08/01/2019   K 5.6 (H) 08/01/2019   CO2 27 08/01/2019   GLUCOSE 90 08/01/2019   BUN 19 08/01/2019   CREATININE 0.76 08/01/2019   BILITOT 0.4 10/26/2019   ALKPHOS 103 10/26/2019   AST 39 10/26/2019   ALT 64 (H) 10/26/2019   PROT 6.9 10/26/2019   ALBUMIN 4.6 10/26/2019   CALCIUM 9.4 08/01/2019   GFR 93.78 06/12/2014   Lab Results  Component Value Date   CHOL 155 08/01/2019   Lab Results  Component Value Date   HDL 48 08/01/2019   Lab Results  Component Value Date   LDLCALC 93 08/01/2019   Lab Results  Component Value Date   TRIG 71 08/01/2019   Lab Results  Component Value Date   CHOLHDL 3.2 08/01/2019   Lab Results  Component Value Date   HGBA1C 5.4 08/01/2019      Assessment & Plan:   Problem List Items Addressed This Visit      Other   Adjustment disorder with mixed anxiety and depressed mood - Primary (Chronic)   Relevant Medications   FLUoxetine (PROZAC) 40 MG capsule    Other Visit Diagnoses    Finger joint swelling, left       Relevant Orders   Antinuclear Antib (ANA)   Rheumatoid Factor   CYCLIC CITRUL PEPTIDE ANTIBODY, IGG/IGA   CBC w/Diff   Uric acid   Comp Met (CMET)   Finger pain, left       Relevant Orders   Antinuclear Antib (ANA)   Rheumatoid Factor   CYCLIC CITRUL PEPTIDE ANTIBODY, IGG/IGA   CBC w/Diff   Uric acid   Comp Met (CMET)    Screening for malignant neoplasm of colon       Relevant Orders   Ambulatory referral to Gastroenterology  Adjustment disorder with mixed anxiety and depressed mood: -PHQ-9 score of 0. Controlled. -Continue current medication regimen. Provided refill. -Will continue to monitor.  Left finger joint swelling and pain: -Symptoms have been ongoing for several months w/o resolution so will place lab orders to evaluate for potential etiology such as infectious or inflammatory arthropathy. Recommend to apply topical anti-inflammatory. -If symptoms fail to improve or worsen recommend further evaluation with imaging studies and/or referral to orthopedics.   Meds ordered this encounter  Medications  . FLUoxetine (PROZAC) 40 MG capsule    Sig: Take 1 capsule (40 mg total) by mouth daily.    Dispense:  90 capsule    Refill:  1    Order Specific Question:   Supervising Provider    Answer:   Beatrice Lecher D [[id number]    Follow-up: Return in about 4 months (around 08/11/2020) for CPE and FBW few days prior.     Lorrene Reid, PA-C

## 2020-04-13 LAB — CBC WITH DIFFERENTIAL/PLATELET
Basophils Absolute: 0.1 10*3/uL (ref 0.0–0.2)
Basos: 1 %
EOS (ABSOLUTE): 0.2 10*3/uL (ref 0.0–0.4)
Eos: 3 %
Hematocrit: 43.1 % (ref 34.0–46.6)
Hemoglobin: 14.2 g/dL (ref 11.1–15.9)
Immature Grans (Abs): 0 10*3/uL (ref 0.0–0.1)
Immature Granulocytes: 0 %
Lymphocytes Absolute: 2.5 10*3/uL (ref 0.7–3.1)
Lymphs: 43 %
MCH: 29.2 pg (ref 26.6–33.0)
MCHC: 32.9 g/dL (ref 31.5–35.7)
MCV: 89 fL (ref 79–97)
Monocytes Absolute: 0.4 10*3/uL (ref 0.1–0.9)
Monocytes: 7 %
Neutrophils Absolute: 2.7 10*3/uL (ref 1.4–7.0)
Neutrophils: 46 %
Platelets: 308 10*3/uL (ref 150–450)
RBC: 4.87 x10E6/uL (ref 3.77–5.28)
RDW: 12.1 % (ref 11.7–15.4)
WBC: 5.8 10*3/uL (ref 3.4–10.8)

## 2020-04-13 LAB — COMPREHENSIVE METABOLIC PANEL
ALT: 46 IU/L — ABNORMAL HIGH (ref 0–32)
AST: 39 IU/L (ref 0–40)
Albumin/Globulin Ratio: 2.1 (ref 1.2–2.2)
Albumin: 4.8 g/dL (ref 3.8–4.8)
Alkaline Phosphatase: 70 IU/L (ref 44–121)
BUN/Creatinine Ratio: 15 (ref 12–28)
BUN: 11 mg/dL (ref 8–27)
Bilirubin Total: <0.2 mg/dL (ref 0.0–1.2)
CO2: 25 mmol/L (ref 20–29)
Calcium: 9.4 mg/dL (ref 8.7–10.3)
Chloride: 103 mmol/L (ref 96–106)
Creatinine, Ser: 0.71 mg/dL (ref 0.57–1.00)
Globulin, Total: 2.3 g/dL (ref 1.5–4.5)
Glucose: 100 mg/dL — ABNORMAL HIGH (ref 65–99)
Potassium: 4.4 mmol/L (ref 3.5–5.2)
Sodium: 143 mmol/L (ref 134–144)
Total Protein: 7.1 g/dL (ref 6.0–8.5)
eGFR: 97 mL/min/{1.73_m2} (ref >59)

## 2020-04-13 LAB — RHEUMATOID FACTOR: Rheumatoid fact SerPl-aCnc: <10 IU/mL (ref <14.0)

## 2020-04-13 LAB — CYCLIC CITRUL PEPTIDE ANTIBODY, IGG/IGA: Cyclic Citrullin Peptide Ab: 8 units (ref 0–19)

## 2020-04-13 LAB — ANA: Anti Nuclear Antibody (ANA): NEGATIVE

## 2020-04-13 LAB — URIC ACID: Uric Acid: 2.8 mg/dL — ABNORMAL LOW (ref 3.0–7.2)

## 2020-08-09 ENCOUNTER — Other Ambulatory Visit: Payer: Self-pay

## 2020-08-09 ENCOUNTER — Other Ambulatory Visit: Payer: 59

## 2020-08-09 DIAGNOSIS — Z0000 Encounter for general adult medical examination without abnormal findings: Secondary | ICD-10-CM

## 2020-08-10 LAB — LIPID PANEL
Chol/HDL Ratio: 3.8 ratio (ref 0.0–4.4)
Cholesterol, Total: 217 mg/dL — ABNORMAL HIGH (ref 100–199)
HDL: 57 mg/dL (ref >39)
LDL Chol Calc (NIH): 138 mg/dL — ABNORMAL HIGH (ref 0–99)
Triglycerides: 124 mg/dL (ref 0–149)
VLDL Cholesterol Cal: 22 mg/dL (ref 5–40)

## 2020-08-10 LAB — COMPREHENSIVE METABOLIC PANEL
ALT: 19 IU/L (ref 0–32)
AST: 19 IU/L (ref 0–40)
Albumin/Globulin Ratio: 2.2 (ref 1.2–2.2)
Albumin: 4.3 g/dL (ref 3.8–4.8)
Alkaline Phosphatase: 68 IU/L (ref 44–121)
BUN/Creatinine Ratio: 22 (ref 12–28)
BUN: 17 mg/dL (ref 8–27)
Bilirubin Total: 0.3 mg/dL (ref 0.0–1.2)
CO2: 27 mmol/L (ref 20–29)
Calcium: 9.3 mg/dL (ref 8.7–10.3)
Chloride: 107 mmol/L — ABNORMAL HIGH (ref 96–106)
Creatinine, Ser: 0.79 mg/dL (ref 0.57–1.00)
Globulin, Total: 2 g/dL (ref 1.5–4.5)
Glucose: 89 mg/dL (ref 65–99)
Potassium: 4.8 mmol/L (ref 3.5–5.2)
Sodium: 147 mmol/L — ABNORMAL HIGH (ref 134–144)
Total Protein: 6.3 g/dL (ref 6.0–8.5)
eGFR: 85 mL/min/{1.73_m2} (ref >59)

## 2020-08-10 LAB — CBC WITH DIFFERENTIAL/PLATELET
Basophils Absolute: 0 10*3/uL (ref 0.0–0.2)
Basos: 1 %
EOS (ABSOLUTE): 0.2 10*3/uL (ref 0.0–0.4)
Eos: 4 %
Hematocrit: 41.6 % (ref 34.0–46.6)
Hemoglobin: 13.4 g/dL (ref 11.1–15.9)
Immature Grans (Abs): 0 10*3/uL (ref 0.0–0.1)
Immature Granulocytes: 0 %
Lymphocytes Absolute: 1.9 10*3/uL (ref 0.7–3.1)
Lymphs: 41 %
MCH: 29.5 pg (ref 26.6–33.0)
MCHC: 32.2 g/dL (ref 31.5–35.7)
MCV: 91 fL (ref 79–97)
Monocytes Absolute: 0.4 10*3/uL (ref 0.1–0.9)
Monocytes: 8 %
Neutrophils Absolute: 2.1 10*3/uL (ref 1.4–7.0)
Neutrophils: 46 %
Platelets: 283 10*3/uL (ref 150–450)
RBC: 4.55 x10E6/uL (ref 3.77–5.28)
RDW: 12.2 % (ref 11.7–15.4)
WBC: 4.6 10*3/uL (ref 3.4–10.8)

## 2020-08-10 LAB — HEMOGLOBIN A1C
Est. average glucose Bld gHb Est-mCnc: 114 mg/dL
Hgb A1c MFr Bld: 5.6 % (ref 4.8–5.6)

## 2020-08-10 LAB — TSH: TSH: 3.33 u[IU]/mL (ref 0.450–4.500)

## 2020-08-13 ENCOUNTER — Encounter: Payer: 59 | Admitting: Physician Assistant

## 2020-09-11 ENCOUNTER — Encounter: Payer: 59 | Admitting: Physician Assistant

## 2020-09-18 ENCOUNTER — Telehealth: Payer: Self-pay | Admitting: Gastroenterology

## 2020-09-18 NOTE — Telephone Encounter (Signed)
Hey Dr. Loletha Carrow,   Received transfer of care of patient for colonoscopy. She has moved to [facility] and due for colon. I have her records from her last colonoscopy and will send for review. Could you please review and advise on scheduling?  Thank you

## 2020-09-19 ENCOUNTER — Encounter: Payer: Self-pay | Admitting: Gastroenterology

## 2020-09-19 NOTE — Telephone Encounter (Signed)
PV 9/16 DIRECT 9/30

## 2020-09-19 NOTE — Telephone Encounter (Signed)
(  For documentation purposes: Report from the [facility] in [place], Dr. Nolen Mu.  Colonoscopy report dated 12/17/2009. No polyps found, unremarkable exam.)  This patient is due for a routine screening colonoscopy.  It can be directly booked with me through the [facility] previsit nursing staff.  - HD

## 2020-09-19 NOTE — Progress Notes (Signed)
Annual physical exam Angelica Knight is a 60 y.o. female who presents today for health maintenance and complete physical. She feels well. She reports exercising few times per week. She reports she is sleeping not as soundly as she would like Constantly wakes up during the night and has tried melatonin 5MG with some success.  -----------------------------------------------------------------  Social History      She  reports that she has never smoked. She has never used smokeless tobacco. She reports current alcohol use. She reports that she does not use drugs.       Social History   Socioeconomic History   Marital status: Married    Spouse name: Not on file   Number of children: Not on file   Years of education: Not on file   Highest education level: Not on file  Occupational History   Not on file  Tobacco Use   Smoking status: Never   Smokeless tobacco: Never  Vaping Use   Vaping Use: Never used  Substance and Sexual Activity   Alcohol use: Yes    Alcohol/week: 0.0 standard drinks    Comment: occasional   Drug use: No   Sexual activity: Not Currently  Other Topics Concern   Not on file  Social History Narrative   Not on file   Social Determinants of Health   Financial Resource Strain: Not on file  Food Insecurity: Not on file  Transportation Needs: Not on file  Physical Activity: Not on file  Stress: Not on file  Social Connections: Not on file    Past Medical History:  Diagnosis Date   Back pain    Chicken pox    Depression    Frequent headaches    Hyperlipidemia    Neck pain    Recent Results (from the past 2160 hour(s))  CBC with Differential/Platelet     Status: None   Collection Time: 08/09/20  8:18 AM  Result Value Ref Range   WBC 4.6 3.4 - 10.8 x10E3/uL   RBC 4.55 3.77 - 5.28 x10E6/uL   Hemoglobin 13.4 11.1 - 15.9 g/dL   Hematocrit 41.6 34.0 - 46.6 %   MCV 91 79 - 97 fL   MCH 29.5 26.6 - 33.0 pg   MCHC 32.2 31.5 - 35.7 g/dL   RDW 12.2 11.7 -  15.4 %   Platelets 283 150 - 450 x10E3/uL   Neutrophils 46 Not Estab. %   Lymphs 41 Not Estab. %   Monocytes 8 Not Estab. %   Eos 4 Not Estab. %   Basos 1 Not Estab. %   Neutrophils Absolute 2.1 1.4 - 7.0 x10E3/uL   Lymphocytes Absolute 1.9 0.7 - 3.1 x10E3/uL   Monocytes Absolute 0.4 0.1 - 0.9 x10E3/uL   EOS (ABSOLUTE) 0.2 0.0 - 0.4 x10E3/uL   Basophils Absolute 0.0 0.0 - 0.2 x10E3/uL   Immature Granulocytes 0 Not Estab. %   Immature Grans (Abs) 0.0 0.0 - 0.1 x10E3/uL  Lipid panel     Status: Abnormal   Collection Time: 08/09/20  8:18 AM  Result Value Ref Range   Cholesterol, Total 217 (H) 100 - 199 mg/dL   Triglycerides 124 0 - 149 mg/dL   HDL 57 >39 mg/dL   VLDL Cholesterol Cal 22 5 - 40 mg/dL   LDL Chol Calc (NIH) 138 (H) 0 - 99 mg/dL   Chol/HDL Ratio 3.8 0.0 - 4.4 ratio    Comment:  T. Chol/HDL Ratio                                             Men  Women                               1/2 Avg.Risk  3.4    3.3                                   Avg.Risk  5.0    4.4                                2X Avg.Risk  9.6    7.1                                3X Avg.Risk 23.4   11.0   Hemoglobin A1c     Status: None   Collection Time: 08/09/20  8:18 AM  Result Value Ref Range   Hgb A1c MFr Bld 5.6 4.8 - 5.6 %    Comment:          Prediabetes: 5.7 - 6.4          Diabetes: >6.4          Glycemic control for adults with diabetes: <7.0    Est. average glucose Bld gHb Est-mCnc 114 mg/dL  Comprehensive metabolic panel     Status: Abnormal   Collection Time: 08/09/20  8:18 AM  Result Value Ref Range   Glucose 89 65 - 99 mg/dL   BUN 17 8 - 27 mg/dL   Creatinine, Ser 0.79 0.57 - 1.00 mg/dL   eGFR 85 >59 mL/min/1.73   BUN/Creatinine Ratio 22 12 - 28   Sodium 147 (H) 134 - 144 mmol/L   Potassium 4.8 3.5 - 5.2 mmol/L   Chloride 107 (H) 96 - 106 mmol/L   CO2 27 20 - 29 mmol/L   Calcium 9.3 8.7 - 10.3 mg/dL   Total Protein 6.3 6.0 - 8.5 g/dL   Albumin  4.3 3.8 - 4.8 g/dL   Globulin, Total 2.0 1.5 - 4.5 g/dL   Albumin/Globulin Ratio 2.2 1.2 - 2.2   Bilirubin Total 0.3 0.0 - 1.2 mg/dL   Alkaline Phosphatase 68 44 - 121 IU/L   AST 19 0 - 40 IU/L   ALT 19 0 - 32 IU/L  TSH     Status: None   Collection Time: 08/09/20  8:18 AM  Result Value Ref Range   TSH 3.330 0.450 - 4.500 uIU/mL    PHQ9 SCORE ONLY 09/24/2020 08/01/2019 06/26/2019  PHQ-9 Total Score 2 0 4   GAD 7 : Generalized Anxiety Score 03/24/2019 02/23/2018 06/23/2016  Nervous, Anxious, on Edge 0 0 0  Control/stop worrying 0 0 0  Worry too much - different things 0 0 0  Trouble relaxing 0 0 0  Restless 0 0 0  Easily annoyed or irritable 0 0 0  Afraid - awful might happen 0 0 0  Total GAD 7 Score 0 0 0     Is the patient deaf or have difficulty hearing?: No Does the patient have  difficulty seeing, even when wearing glasses/contacts?: Yes Does the patient have difficulty concentrating, remembering, or making decisions?: Yes Does the patient have difficulty walking or climbing stairs?: No Does the patient have difficulty dressing or bathing?: No Does the patient have difficulty doing errands alone such as visiting a doctor's office or shopping?: No    Patient Active Problem List   Diagnosis Date Noted   Lateral epicondylitis of right elbow 03/24/2019   Encounter for wellness examination 05/03/2017   Papanicolaou smear for cervical cancer screening 05/03/2017   Travel advice encounter 06/23/2016   History of migraine headaches 11/24/2015   Hypertriglyceridemia 11/24/2015   Atypical nevi 11/24/2015   Vitamin D deficiency 09/10/2015   Right bundle branch block 08/29/2015   Nonspecific chest pain 08/29/2015   Adjustment disorder with mixed anxiety and depressed mood 08/29/2015   Mixed hyperlipidemia 08/29/2015   Dyspareunia 05/03/2014   Frequent headaches 05/03/2014   Depression 05/03/2014    Past Surgical History:  Procedure Laterality Date   CESAREAN SECTION      TONSILLECTOMY     TUBAL LIGATION      Family History        Family Status  Relation Name Status   Mother  Deceased at age 35   Father  Deceased at age 53   Sister  (Not Specified)   Mat Aunt  (Not Specified)   Sister  (Not Specified)   Sister  (Not Specified)   MGM  Deceased   MGF  Deceased   PGM  Deceased   PGF  Deceased        Her family history includes Alzheimer's disease in her mother; Arthritis in her maternal aunt, mother, and sister; Bipolar disorder in her sister; Cancer in her father and mother; Diabetes in her father and sister; Hypertension in her father and sister.     Patient Care Team: Lorrene Reid, PA-C as PCP - General   Review of Systems  Psychiatric/Behavioral:  The patient has insomnia.   All other systems reviewed and are negative.  Today's Vitals   09/24/20 1312  BP: (!) 142/79  Pulse: 60  Temp: 98.4 F (36.9 C)  SpO2: 98%  Weight: 126 lb 12.8 oz (57.5 kg)  Height: _0  (1.549 m)   Body mass index is 23.96 kg/m.   General Appearance:    Well developed, well nourished female. Alert, cooperative, in no acute distress, appears stated age   Head:    Normocephalic, without obvious abnormality, atraumatic  Eyes:    PERRL, conjunctiva/corneas clear, EOM's intact, fundi    benign, both eyes  Ears:    Normal TM's and external ear canals, both ears  Nose:   Nares normal, septum midline, mucosa normal, no drainage    or sinus tenderness  Throat:   Lips, mucosa, and tongue normal; teeth and gums normal  Neck:   Supple, symmetrical, trachea midline, no adenopathy;    thyroid:  no enlargement/tenderness/nodules; no carotid   bruit or JVD  Back:     Symmetric, no curvature, ROM normal, no CVA tenderness  Lungs:     Clear to auscultation bilaterally, respirations unlabored  Chest Wall:    No tenderness or deformity   Heart:    Normal heart rate. Normal rhythm. No murmurs, rubs, or gallops.    Breast Exam:    deferred  Abdomen:     Soft, non-tender,  bowel sounds active all four quadrants,    no masses, no organomegaly  Pelvic:    deferred  Extremities:   All extremities are intact. No cyanosis or edema  Pulses:   2+ and symmetric all extremities  Skin:   Skin color, texture, turgor normal, no rashes or lesions  Lymph nodes:   Cervical, supraclavicular, and axillary nodes normal  Neurologic:   CNII-XII grossly intact   Assessment: -Healthy female exam.  Plan: -Discussed most recent labs which are are essentially within normal limits or stable from prior. Liver enzymes normalized. The 10-year ASCVD risk score Mikey Bussing DC Jr., et al., 2013) is: 4.6% -Recommend to follow a heart healthy diet and increase physical activity. -UTD pap smear. Patient will schedule mammogram. Agreeable to pneumonia vaccine. Scheduled for colonoscopy 10/18/2020. -Good sleep hygiene discussed. Continue melatonin as needed. -Follow up in 6 months for mood, HLD and FBW (lipid panel, cmo) few days prior

## 2020-09-19 NOTE — Patient Instructions (Signed)
Preventive Care 40-61 Years Old, Female Preventive care refers to lifestyle choices and visits with your health care provider that can promote health and wellness. This includes: A yearly physical exam. This is also called an annual wellness visit. Regular dental and eye exams. Immunizations. Screening for certain conditions. Healthy lifestyle choices, such as: Eating a healthy diet. Getting regular exercise. Not using drugs or products that contain nicotine and tobacco. Limiting alcohol use. What can I expect for my preventive care visit? Physical exam Your health care provider will check your: Height and weight. These may be used to calculate your BMI (body mass index). BMI is a measurement that tells if you are at a healthy weight. Heart rate and blood pressure. Body temperature. Skin for abnormal spots. Counseling Your health care provider may ask you questions about your: Past medical problems. Family's medical history. Alcohol, tobacco, and drug use. Emotional well-being. Home life and relationship well-being. Sexual activity. Diet, exercise, and sleep habits. Work and work environment. Access to firearms. Method of birth control. Menstrual cycle. Pregnancy history. What immunizations do I need? Vaccines are usually given at various ages, according to a schedule. Your health care provider will recommend vaccines for you based on your age, medical history, and lifestyle or other factors, such as travel or where you work. What tests do I need? Blood tests Lipid and cholesterol levels. These may be checked every 5 years, or more often if you are over 61 years old. Hepatitis C test. Hepatitis B test. Screening Lung cancer screening. You may have this screening every year starting at age 61 if you have a 30-pack-year history of smoking and currently smoke or have quit within the past 15 years. Colorectal cancer screening. All adults should have this screening starting at  age 61 and continuing until age 75. Your health care provider may recommend screening at age 45 if you are at increased risk. You will have tests every 1-10 years, depending on your results and the type of screening test. Diabetes screening. This is done by checking your blood sugar (glucose) after you have not eaten for a while (fasting). You may have this done every 1-3 years. Mammogram. This may be done every 1-2 years. Talk with your health care provider about when you should start having regular mammograms. This may depend on whether you have a family history of breast cancer. BRCA-related cancer screening. This may be done if you have a family history of breast, ovarian, tubal, or peritoneal cancers. Pelvic exam and Pap test. This may be done every 3 years starting at age 61. Starting at age 30, this may be done every 5 years if you have a Pap test in combination with an HPV test. Other tests STD (sexually transmitted disease) testing, if you are at risk. Bone density scan. This is done to screen for osteoporosis. You may have this scan if you are at high risk for osteoporosis. Talk with your health care provider about your test results, treatment options, and if necessary, the need for more tests. Follow these instructions at home: Eating and drinking  Eat a diet that includes fresh fruits and vegetables, whole grains, lean protein, and low-fat dairy products. Take vitamin and mineral supplements as recommended by your health care provider. Do not drink alcohol if: Your health care provider tells you not to drink. You are pregnant, may be pregnant, or are planning to become pregnant. If you drink alcohol: Limit how much you have to 0-1 drink a day. Be   aware of how much alcohol is in your drink. In the [place] one drink equals one 12 oz bottle of beer (355 mL), one 5 oz glass of wine (148 mL), or one 1 oz glass of hard liquor (44 mL). Lifestyle Take daily care of your teeth and  gums. Brush your teeth every morning and night with fluoride toothpaste. Floss one time each day. Stay active. Exercise for at least 30 minutes 5 or more days each week. Do not use any products that contain nicotine or tobacco, such as cigarettes, e-cigarettes, and chewing tobacco. If you need help quitting, ask your health care provider. Do not use drugs. If you are sexually active, practice safe sex. Use a condom or other form of protection to prevent STIs (sexually transmitted infections). If you do not wish to become pregnant, use a form of birth control. If you plan to become pregnant, see your health care provider for a prepregnancy visit. If told by your health care provider, take low-dose aspirin daily starting at age 61. Find healthy ways to cope with stress, such as: Meditation, yoga, or listening to music. Journaling. Talking to a trusted person. Spending time with friends and family. Safety Always wear your seat belt while driving or riding in a vehicle. Do not drive: If you have been drinking alcohol. Do not ride with someone who has been drinking. When you are tired or distracted. While texting. Wear a helmet and other protective equipment during sports activities. If you have firearms in your house, make sure you follow all gun safety procedures. What's next? Visit your health care provider once a year for an annual wellness visit. Ask your health care provider how often you should have your eyes and teeth checked. Stay up to date on all vaccines. This information is not intended to replace advice given to you by your health care provider. Make sure you discuss any questions you have with your health care provider. Document Revised: 03/15/2020 Document Reviewed: 09/16/2017 Elsevier Patient Education  2022 [employer].

## 2020-09-24 ENCOUNTER — Other Ambulatory Visit: Payer: Self-pay

## 2020-09-24 ENCOUNTER — Encounter: Payer: Self-pay | Admitting: Physician Assistant

## 2020-09-24 ENCOUNTER — Ambulatory Visit (INDEPENDENT_AMBULATORY_CARE_PROVIDER_SITE_OTHER): Payer: 59 | Admitting: Physician Assistant

## 2020-09-24 VITALS — BP 142/79 | HR 60 | Temp 98.4°F | Ht 61.0 in | Wt 126.8 lb

## 2020-09-24 DIAGNOSIS — Z23 Encounter for immunization: Secondary | ICD-10-CM

## 2020-09-24 DIAGNOSIS — Z0000 Encounter for general adult medical examination without abnormal findings: Secondary | ICD-10-CM

## 2020-09-24 DIAGNOSIS — Z1211 Encounter for screening for malignant neoplasm of colon: Secondary | ICD-10-CM

## 2020-09-24 DIAGNOSIS — E782 Mixed hyperlipidemia: Secondary | ICD-10-CM | POA: Diagnosis not present

## 2020-09-24 DIAGNOSIS — F5101 Primary insomnia: Secondary | ICD-10-CM

## 2020-09-24 DIAGNOSIS — E559 Vitamin D deficiency, unspecified: Secondary | ICD-10-CM

## 2020-09-24 DIAGNOSIS — F4323 Adjustment disorder with mixed anxiety and depressed mood: Secondary | ICD-10-CM

## 2020-10-04 ENCOUNTER — Other Ambulatory Visit: Payer: Self-pay

## 2020-10-04 ENCOUNTER — Ambulatory Visit (AMBULATORY_SURGERY_CENTER): Payer: 59 | Admitting: *Deleted

## 2020-10-04 VITALS — Ht 61.0 in | Wt 126.0 lb

## 2020-10-04 DIAGNOSIS — Z1211 Encounter for screening for malignant neoplasm of colon: Secondary | ICD-10-CM

## 2020-10-04 MED ORDER — NA SULFATE-K SULFATE-MG SULF 17.5-3.13-1.6 GM/177ML PO SOLN: 1.0000 | ORAL | 0 refills | Status: DC

## 2020-10-04 NOTE — Progress Notes (Signed)

## 2020-10-08 ENCOUNTER — Encounter: Payer: Self-pay | Admitting: Gastroenterology

## 2020-10-18 ENCOUNTER — Other Ambulatory Visit: Payer: Self-pay

## 2020-10-18 ENCOUNTER — Encounter: Payer: Self-pay | Admitting: Gastroenterology

## 2020-10-18 ENCOUNTER — Ambulatory Visit (AMBULATORY_SURGERY_CENTER): Payer: 59 | Admitting: Gastroenterology

## 2020-10-18 VITALS — BP 106/59 | HR 55 | Temp 96.4°F | Resp 9 | Ht 61.0 in | Wt 126.0 lb

## 2020-10-18 DIAGNOSIS — K635 Polyp of colon: Secondary | ICD-10-CM

## 2020-10-18 DIAGNOSIS — D122 Benign neoplasm of ascending colon: Secondary | ICD-10-CM | POA: Diagnosis not present

## 2020-10-18 DIAGNOSIS — D120 Benign neoplasm of cecum: Secondary | ICD-10-CM | POA: Diagnosis not present

## 2020-10-18 DIAGNOSIS — D123 Benign neoplasm of transverse colon: Secondary | ICD-10-CM

## 2020-10-18 DIAGNOSIS — Z1211 Encounter for screening for malignant neoplasm of colon: Secondary | ICD-10-CM | POA: Diagnosis not present

## 2020-10-18 MED ORDER — SODIUM CHLORIDE 0.9 % IV SOLN: 500.0000 mL | Freq: Once | INTRAVENOUS | Status: AC

## 2020-10-18 NOTE — Progress Notes (Signed)
Called to room to assist during endoscopic procedure.  Patient ID and intended procedure confirmed with present staff. Received instructions for my participation in the procedure from the performing physician.  

## 2020-10-18 NOTE — Progress Notes (Signed)
Pt's states no medical or surgical changes since previsit or office visit. VS assessed by C.W 

## 2020-10-18 NOTE — Op Note (Addendum)
[facility] Patient Name: Angelica Knight Procedure Date: 10/18/2020 10:31 AM MRN: [id number] Endoscopist: Mallie Mussel L. Loletha Carrow , MD Age: 60 Referring MD:  Date of Birth: [birth year] Gender: Female Account #: [id number] Procedure:                Colonoscopy Indications:              Screening for colorectal malignant neoplasm                           patient reports normal colonoscopy in [place] 2011 Medicines:                Monitored Anesthesia Care Procedure:                Pre-Anesthesia Assessment:                           - Prior to the procedure, a History and Physical                            was performed, and patient medications and                            allergies were reviewed. The patient's tolerance of                            previous anesthesia was also reviewed. The risks                            and benefits of the procedure and the sedation                            options and risks were discussed with the patient.                            All questions were answered, and informed consent                            was obtained. Prior Anticoagulants: The patient has                            taken no previous anticoagulant or antiplatelet                            agents. ASA Grade Assessment: II - A patient with                            mild systemic disease. After reviewing the risks                            and benefits, the patient was deemed in                            satisfactory condition to undergo the procedure.  After obtaining informed consent, the colonoscope                            was passed under direct vision. Throughout the                            procedure, the patient's blood pressure, pulse, and                            oxygen saturations were monitored continuously. The                            Olympus CF-HQ190L (Serial# [id number]) Colonoscope was                            introduced through  the anus and advanced to the the                            cecum, identified by appendiceal orifice and                            ileocecal valve. The colonoscopy was performed                            without difficulty. The patient tolerated the                            procedure well. The quality of the bowel                            preparation was good. The ileocecal valve,                            appendiceal orifice, and rectum were photographed. Scope In: 10:40:59 AM Scope Out: 11:06:50 AM Scope Withdrawal Time: 0 hours 20 minutes 39 seconds  Total Procedure Duration: 0 hours 25 minutes 51 seconds  Findings:                 The perianal and digital rectal examinations were                            normal.                           Three semi-sessile polyps were found in the                            transverse colon, ascending colon and cecum. The                            polyps were 1 to 6 mm in size. These polyps were                            removed with a cold snare. Resection and retrieval  were complete.                           A 5 mm polyp was found in the transverse colon. The                            polyp was semi-sessile. The polyp was removed with                            a cold snare. Resection and retrieval were complete.                           Internal hemorrhoids were found.                           The exam was otherwise without abnormality on                            direct and retroflexion views. Complications:            No immediate complications. Estimated Blood Loss:     Estimated blood loss was minimal. Impression:               - Three 1 to 6 mm polyps in the transverse colon,                            in the ascending colon and in the cecum, removed                            with a cold snare. Resected and retrieved.                           - One 5 mm polyp in the transverse colon, removed                             with a cold snare. Resected and retrieved.                           - Internal hemorrhoids.                           - The examination was otherwise normal on direct                            and retroflexion views. Recommendation:           - Patient has a contact number available for                            emergencies. The signs and symptoms of potential                            delayed complications were discussed with the  patient. Return to normal activities tomorrow.                            Written discharge instructions were provided to the                            patient.                           - Resume previous diet.                           - Continue present medications.                           - Await pathology results.                           - Repeat colonoscopy is recommended for                            surveillance. The colonoscopy date will be                            determined after pathology results from today's                            exam become available for review. Avalynn Bowe L. Loletha Carrow, MD 10/18/2020 11:14:26 AM This report has been signed electronically.

## 2020-10-18 NOTE — Progress Notes (Signed)
To PACU, VSS. Report to [email or web address] 

## 2020-10-18 NOTE — Progress Notes (Signed)
History and Physical:  This patient presents for endoscopic testing for: Encounter Diagnosis  Name Primary?   Special screening for malignant neoplasms, colon Yes    Patient without complaints today.    Past Medical History: Past Medical History:  Diagnosis Date   Back pain    Chicken pox    Depression    Frequent headaches    Hyperlipidemia    Neck pain      Past Surgical History: Past Surgical History:  Procedure Laterality Date   CESAREAN SECTION     COLONOSCOPY  12/17/2009   in [place]- normal exam   TONSILLECTOMY     TUBAL LIGATION      Allergies: No Known Allergies  Outpatient Meds: Current Outpatient Medications  Medication Sig Dispense Refill   Calcium Carbonate-Vitamin D 600-400 MG-UNIT tablet Take at least 2000 vitamin D per day and at least 1200 of calcium per day 60 tablet 11   FLUoxetine (PROZAC) 40 MG capsule Take 1 capsule (40 mg total) by mouth daily. 90 capsule 1   COLLAGEN PO Take by mouth.     Omega-3 Fatty Acids (FISH OIL PO) Take by mouth.     Current Facility-Administered Medications  Medication Dose Route Frequency Provider Last Rate Last Admin   0.9 %  sodium chloride infusion  500 mL Intravenous Once Danis, Estill Cotta III, MD          ___________________________________________________________________ Objective   Exam:  BP (!) 136/59   Pulse (!) 58   Temp (!) 96.4 F (35.8 C) (Skin)   Ht 5\' 1"  (1.549 m)   Wt 126 lb (57.2 kg)   SpO2 98%   BMI 23.81 kg/m   CV: RRR without murmur, S1/S2 Resp: clear to auscultation bilaterally, normal RR and effort noted GI: soft, no tenderness, with active bowel sounds.   Assessment: Encounter Diagnosis  Name Primary?   Special screening for malignant neoplasms, colon Yes     Plan: Colonoscopy  The benefits and risks of the planned procedure were described in detail with the patient or (when appropriate) their health care proxy.  Risks were outlined as including, but not limited to,  bleeding, infection, perforation, adverse medication reaction leading to cardiac or pulmonary decompensation, pancreatitis (if ERCP).  The limitation of incomplete mucosal visualization was also discussed.  No guarantees or warranties were given.    The patient is appropriate for an endoscopic procedure in the ambulatory setting.   - Wilfrid Lund, MD

## 2020-10-18 NOTE — Patient Instructions (Signed)
Information on polyps and hemorrhoids given to you today.  Await pathology results.  Resume previous diet and medications.   YOU HAD AN ENDOSCOPIC PROCEDURE TODAY AT [facility]:   Refer to the procedure report that was given to you for any specific questions about what was found during the examination.  If the procedure report does not answer your questions, please call your gastroenterologist to clarify.  If you requested that your care partner not be given the details of your procedure findings, then the procedure report has been included in a sealed envelope for you to review at your convenience later.  YOU SHOULD EXPECT: Some feelings of bloating in the abdomen. Passage of more gas than usual.  Walking can help get rid of the air that was put into your GI tract during the procedure and reduce the bloating. If you had a lower endoscopy (such as a colonoscopy or flexible sigmoidoscopy) you may notice spotting of blood in your stool or on the toilet paper. If you underwent a bowel prep for your procedure, you may not have a normal bowel movement for a few days.  Please Note:  You might notice some irritation and congestion in your nose or some drainage.  This is from the oxygen used during your procedure.  There is no need for concern and it should clear up in a day or so.  SYMPTOMS TO REPORT IMMEDIATELY:  Following lower endoscopy (colonoscopy or flexible sigmoidoscopy):  Excessive amounts of blood in the stool  Significant tenderness or worsening of abdominal pains  Swelling of the abdomen that is new, acute  Fever of 100F or higher   For urgent or emergent issues, a gastroenterologist can be reached at any hour by calling [phone]. Do not use MyChart messaging for urgent concerns.    DIET:  We do recommend a small meal at first, but then you may proceed to your regular diet.  Drink plenty of fluids but you should avoid alcoholic beverages for 24  hours.  ACTIVITY:  You should plan to take it easy for the rest of today and you should NOT DRIVE or use heavy machinery until tomorrow (because of the sedation medicines used during the test).    FOLLOW UP: Our staff will call the number listed on your records 48-72 hours following your procedure to check on you and address any questions or concerns that you may have regarding the information given to you following your procedure. If we do not reach you, we will leave a message.  We will attempt to reach you two times.  During this call, we will ask if you have developed any symptoms of COVID 19. If you develop any symptoms (ie: fever, flu-like symptoms, shortness of breath, cough etc.) before then, please call [phone].  If you test positive for Covid 19 in the 2 weeks post procedure, please call and report this information to [place].    If any biopsies were taken you will be contacted by phone or by letter within the next 1-3 weeks.  Please call us at [phone] if you have not heard about the biopsies in 3 weeks.    SIGNATURES/CONFIDENTIALITY: You and/or your care partner have signed paperwork which will be entered into your electronic medical record.  These signatures attest to the fact that that the information above on your After Visit Summary has been reviewed and is understood.  Full responsibility of the confidentiality of this discharge information lies with you and/or  your care-partner.

## 2020-10-22 ENCOUNTER — Telehealth: Payer: Self-pay | Admitting: *Deleted

## 2020-10-22 NOTE — Telephone Encounter (Signed)
No answer for post procedure call back. Left VM. 

## 2020-10-22 NOTE — Telephone Encounter (Signed)
Attempted 2nd phone call. No answer. Left message.

## 2020-10-23 ENCOUNTER — Encounter: Payer: Self-pay | Admitting: Gastroenterology

## 2021-01-16 ENCOUNTER — Telehealth: Payer: Self-pay | Admitting: Physician Assistant

## 2021-01-16 NOTE — Telephone Encounter (Signed)
Patient called and stated she tested + for covid this morning and is requesting the antiviral medication to be sent in to pharmacy. Please advise [phone]

## 2021-01-17 ENCOUNTER — Encounter: Payer: Self-pay | Admitting: Physician Assistant

## 2021-01-17 DIAGNOSIS — U071 COVID-19: Secondary | ICD-10-CM

## 2021-01-17 MED ORDER — MOLNUPIRAVIR EUA 200MG CAPSULE: 4.0000 | ORAL_CAPSULE | Freq: Two times a day (BID) | ORAL | 0 refills | Status: AC

## 2021-02-20 ENCOUNTER — Ambulatory Visit
Admission: RE | Admit: 2021-02-20 | Discharge: 2021-02-20 | Disposition: A | Discharge status: Home / Self Care | Payer: 59 | Source: Ambulatory Visit | Attending: Physician Assistant | Admitting: Physician Assistant

## 2021-02-20 ENCOUNTER — Other Ambulatory Visit: Payer: Self-pay

## 2021-02-20 DIAGNOSIS — Z1231 Encounter for screening mammogram for malignant neoplasm of breast: Secondary | ICD-10-CM | POA: Diagnosis not present

## 2021-03-24 ENCOUNTER — Ambulatory Visit: Payer: 59 | Admitting: Physician Assistant

## 2021-04-03 ENCOUNTER — Other Ambulatory Visit: Payer: Self-pay

## 2021-04-04 ENCOUNTER — Other Ambulatory Visit: Payer: Self-pay

## 2021-04-04 ENCOUNTER — Other Ambulatory Visit: Payer: 59

## 2021-04-04 DIAGNOSIS — E878 Other disorders of electrolyte and fluid balance, not elsewhere classified: Secondary | ICD-10-CM

## 2021-04-04 DIAGNOSIS — E782 Mixed hyperlipidemia: Secondary | ICD-10-CM

## 2021-04-05 LAB — LIPID PANEL
Chol/HDL Ratio: 3.1 ratio (ref 0.0–4.4)
Cholesterol, Total: 180 mg/dL (ref 100–199)
HDL: 59 mg/dL (ref >39)
LDL Chol Calc (NIH): 109 mg/dL — ABNORMAL HIGH (ref 0–99)
Triglycerides: 65 mg/dL (ref 0–149)
VLDL Cholesterol Cal: 12 mg/dL (ref 5–40)

## 2021-04-05 LAB — COMPREHENSIVE METABOLIC PANEL
ALT: 28 IU/L (ref 0–32)
AST: 22 IU/L (ref 0–40)
Albumin/Globulin Ratio: 2.3 — ABNORMAL HIGH (ref 1.2–2.2)
Albumin: 4.5 g/dL (ref 3.8–4.8)
Alkaline Phosphatase: 65 IU/L (ref 44–121)
BUN/Creatinine Ratio: 22 (ref 12–28)
BUN: 17 mg/dL (ref 8–27)
Bilirubin Total: 0.4 mg/dL (ref 0.0–1.2)
CO2: 26 mmol/L (ref 20–29)
Calcium: 9 mg/dL (ref 8.7–10.3)
Chloride: 105 mmol/L (ref 96–106)
Creatinine, Ser: 0.77 mg/dL (ref 0.57–1.00)
Globulin, Total: 2 g/dL (ref 1.5–4.5)
Glucose: 87 mg/dL (ref 70–99)
Potassium: 4.7 mmol/L (ref 3.5–5.2)
Sodium: 144 mmol/L (ref 134–144)
Total Protein: 6.5 g/dL (ref 6.0–8.5)
eGFR: 87 mL/min/{1.73_m2} (ref >59)

## 2021-04-07 ENCOUNTER — Encounter: Payer: Self-pay | Admitting: Physician Assistant

## 2021-04-10 ENCOUNTER — Ambulatory Visit: Payer: 59 | Admitting: Physician Assistant

## 2021-04-10 NOTE — Progress Notes (Signed)
?Established patient visit ? ? ?Patient: Angelica Knight   DOB: 09/23/1960   61 y.o. Female  MRN: [id number] ?Visit Date: 04/11/2021 ? ?Chief Complaint  ?Patient presents with  ? Follow-up  ?  Mood  ? Hyperlipidemia  ? ?Subjective  ?  ?HPI ?HPI   ? ? Follow-up   ? Additional comments: Mood ? ?  ?  ?Last edited by Aron Baba, CMA on 04/11/2021  9:37 AM.  ?  ?  ?Patient presents for follow up on hyperlipidemia, mood and insomnia. ? ?HLD: Pt trying to manage with diet. Reports has been more careful about what she is eating. ? ?Mood: Patient reports mood has been stable. Taking medication as directed. Denies labile mood, SI/HI. ? ?Insomnia: States sometimes has trouble staying asleep. Takes melatonin which is not always effective. ? ? ?  04/11/2021  ?  9:39 AM 09/24/2020  ?  1:16 PM 08/01/2019  ?  8:53 AM 06/26/2019  ?  2:00 PM 03/24/2019  ?  9:05 AM  ?Depression screen PHQ 2/9  ?Decreased Interest 0 0 0 0 0  ?Down, Depressed, Hopeless 0 0 0 0 0  ?PHQ - 2 Score 0 0 0 0 0  ?Altered sleeping 1 1 0 1 3  ?Tired, decreased energy 1 0 0 1 2  ?Change in appetite 1 0 0 2 0  ?Feeling bad or failure about yourself  0 0 0 0 0  ?Trouble concentrating 1 1 0 0 1  ?Moving slowly or fidgety/restless 0 0 0 0 0  ?Suicidal thoughts 0 0 0 0 0  ?PHQ-9 Score 4 2 0 4 6  ?Difficult doing work/chores Not difficult at all Not difficult at all  Not difficult at all Not difficult at all  ? ? ?  04/11/2021  ?  9:39 AM 03/24/2019  ?  9:06 AM 02/23/2018  ? 11:29 AM 06/23/2016  ?  2:11 PM  ?GAD 7 : Generalized Anxiety Score  ?Nervous, Anxious, on Edge 0 0 0 0  ?Control/stop worrying 0 0 0 0  ?Worry too much - different things 0 0 0 0  ?Trouble relaxing 0 0 0 0  ?Restless 0 0 0 0  ?Easily annoyed or irritable 0 0 0 0  ?Afraid - awful might happen 0 0 0 0  ?Total GAD 7 Score 0 0 0 0  ?Anxiety Difficulty Not difficult at all     ? ? ?  ? ? ?Medications: ?Outpatient Medications Prior to Visit  ?Medication Sig  ? Calcium Carbonate-Vitamin D 600-400 MG-UNIT tablet  Take at least 2000 vitamin D per day and at least 1200 of calcium per day  ? COLLAGEN PO Take by mouth.  ? FLUoxetine (PROZAC) 40 MG capsule Take 1 capsule (40 mg total) by mouth daily.  ? Omega-3 Fatty Acids (FISH OIL PO) Take by mouth.  ? ?Facility-Administered Medications Prior to Visit  ?Medication Dose Route Frequency Provider  ? 0.9 %  sodium chloride infusion  500 mL Intravenous Once Doran Stabler, MD  ? ? ?Review of Systems ?Review of Systems:  ?A fourteen system review of systems was performed and found to be positive as per HPI. ? ? ? ?  Objective  ?  ?BP 111/67   Pulse 60   Temp (!) 97.5 ?F (36.4 ?C)   Ht 5' 0.75" (1.543 m)   Wt 128 lb (58.1 kg)   SpO2 98%   BMI 24.38 kg/m?  ?BP Readings from Last 3 Encounters:  ?  04/11/21 111/67  ?10/18/20 (!) 106/59  ?09/24/20 (!) 142/79  ? ?Wt Readings from Last 3 Encounters:  ?04/11/21 128 lb (58.1 kg)  ?10/18/20 126 lb (57.2 kg)  ?10/04/20 126 lb (57.2 kg)  ? ? ?Physical Exam  ?General:  Well Developed, well nourished, appropriate for stated age.  ?Neuro:  Alert and oriented,  extra-ocular muscles intact  ?HEENT:  Normocephalic, atraumatic, neck supple  ?Skin:  no gross rash, warm, pink. ?Cardiac:  RRR, S1 S2 ?Respiratory: CTA B/L  ?Vascular:  Ext warm, no cyanosis apprec.; cap RF less 2 sec. ?Psych:  No HI/SI, judgement and insight good, Euthymic mood. Full Affect. ? ? ?No results found for any visits on 04/11/21. ? Assessment & Plan  ?  ? ? ?Problem List Items Addressed This Visit   ? ?  ? Other  ? Adjustment disorder with mixed anxiety and depressed mood (Chronic)  ?  -Stable. ?-Continue current medication regimen. See med list. ?-Will continue to monitor. ?  ?  ? Mixed hyperlipidemia - Primary (Chronic)  ?  -Discussed recent lipid panel which has improved. LDL decreased from 138 to 109. Recommend to continue with diet changes and follow a low fat diet. Continue to stay as active as possible. Will repeat lipid panel with CPE. Discussed CMP, liver enzymes  remain normal. ?The 10-year ASCVD risk score (Arnett DK, et al., 2019) is: 2.8% ? ?  ?  ? Primary insomnia  ?  -Good sleep hygiene discussed. Recommend to continue melatonin as needed or trial magnesium supplement.  ?  ?  ? ? ?Return in about 6 months (around 10/12/2021) for CPE and FBW few days prior .  ?   ? ? ? ?Lorrene Reid, PA-C  ?[facility] ?[phone] (phone) ?[phone] (fax) ? ?[facility] ?

## 2021-04-11 ENCOUNTER — Other Ambulatory Visit: Payer: Self-pay

## 2021-04-11 ENCOUNTER — Encounter: Payer: Self-pay | Admitting: Physician Assistant

## 2021-04-11 ENCOUNTER — Ambulatory Visit (INDEPENDENT_AMBULATORY_CARE_PROVIDER_SITE_OTHER): Payer: 59 | Admitting: Physician Assistant

## 2021-04-11 VITALS — BP 111/67 | HR 60 | Temp 97.5°F | Ht 60.75 in | Wt 128.0 lb

## 2021-04-11 DIAGNOSIS — E782 Mixed hyperlipidemia: Secondary | ICD-10-CM

## 2021-04-11 DIAGNOSIS — F4323 Adjustment disorder with mixed anxiety and depressed mood: Secondary | ICD-10-CM | POA: Diagnosis not present

## 2021-04-11 DIAGNOSIS — F5101 Primary insomnia: Secondary | ICD-10-CM

## 2021-04-11 NOTE — Assessment & Plan Note (Addendum)
-  Discussed recent lipid panel which has improved. LDL decreased from 138 to 109. Recommend to continue with diet changes and follow a low fat diet. Continue to stay as active as possible. Will repeat lipid panel with CPE. Discussed CMP, liver enzymes remain normal. ?The 10-year ASCVD risk score (Arnett DK, et al., 2019) is: 2.8% ? ?

## 2021-04-11 NOTE — Patient Instructions (Signed)
Heart Disease Prevention ?Heart disease is the leading cause of death in the world. Coronary artery disease is the most common cause of heart disease. This condition results when cholesterol and other substances (plaque) build up inside the walls of the blood vessels that supply your heart muscle (arteries). This buildup in arteries is called atherosclerosis. You can take actions to lower your risk of heart disease. ?How can heart disease affect me? ?Heart disease can cause many unpleasant symptoms and complications, such as: ?Chest pain (angina). ?Reduced or blocked blood flow to your heart. This can cause: ?Irregular heartbeats (arrhythmias). ?Heart attack. ?Heart failure. ?What can increase my risk? ?The following factors may make you more likely to develop this condition: ?High blood pressure (hypertension). ?High cholesterol. ?Smoking. ?A diet high in saturated fats or trans fats. ?Lack of physical activity. ?Obesity. ?Drinking too much alcohol. ?Diabetes. ?Having a family history of heart disease. ?What actions can I take to prevent heart disease? ?Nutrition ? ?Eat a heart-healthy eating plan as told by your health care provider. Examples include the DASH (Dietary Approaches to Stop Hypertension) eating plan or the Mediterranean diet. ?Generally, it is recommended that you: ?Eat less salt (sodium). Ask your health care provider how much sodium is safe for you. Most people should have less than 2,300 mg each day. ?Limit unhealthy fats, such as saturated and trans fats, in your diet. You can do this by eating low-fat dairy products, eating less red meat, and avoiding processed foods. ?Eat healthy fats (omega-3 fatty acids). These are found in fish, such as mackerel or salmon. ?Eat more fruits and vegetables. You should try to fill one-half of your plate with fruits and vegetables at each meal. ?Eat more whole grains. ?Avoid foods and drinks that have added sugars. ?Lifestyle ? ?Get regular exercise. This is one  of the most important things you can do for your health. Generally, it is recommended that you: ?Exercise for at least 30 minutes on most days of the week (150 minutes each week). The exercise should increase your heart rate and make you sweat (aerobic exercise). ?Add strength exercises on at least 2 days each week. ?Do not use any products that contain nicotine or tobacco, such as cigarettes and e-cigarettes. These can damage your heart and blood vessels. If you need help quitting, ask your health care provider. ?Alcohol use ?Do not drink alcohol if: ?Your health care provider tells you not to drink. ?You are pregnant, may be pregnant, or are planning to become pregnant. ?If you drink alcohol, limit how much you have: ?0-1 drink a day for women. ?0-2 drinks a day for men. ?Be aware of how much alcohol is in your drink. In the [place] one drink equals one typical bottle of beer (12 oz), one-half glass of wine (5 oz), or one shot of hard liquor (1? oz). ?Medicines ?Take over-the-counter and prescription medicines only as told by your health care provider. ?Ask your health care provider whether you should take an aspirin every day. Taking aspirin may help reduce your risk of heart disease and stroke. ?Depending on your risk factors, your health care provider may prescribe medicines to lower your risk of heart disease or to control related conditions. You may take medicine to: ?Lower cholesterol. ?Control blood pressure. ?Control diabetes. ?General information ?Keep your blood pressure under control, as recommended by your health care provider. For most healthy people, the upper number of your blood pressure (systolic) should be no higher than 120, and the lower number (diastolic)  no higher than 80. Treatment may be needed if your blood pressure is higher than 130/80. ?Have your blood pressure checked at least every two years. Your health care provider may check your blood pressure more often if you have high blood  pressure. ?After age 65, have your cholesterol checked every 4-6 years. If you have risk factors for heart disease, you may need to have it checked more frequently. Treatment may be needed if your cholesterol is high. ?Have your body mass index (BMI) checked every year. Your health care provider can calculate your BMI from your height and weight. ?Work with your health care provider to lose weight, if needed, or to maintain a healthy weight. ?Where to find more information: ?Centers for Disease Control and Prevention: TextNotebook.fi ?American Heart Association: www.heart.org ?Take a free online heart disease risk quiz to better understand your personal risk factors. ?Summary ?Heart disease is the leading cause of death in the world. ?Heart disease can cause chest pain, abnormal heart rhythms, heart attack, and heart failure. ?High blood pressure, high cholesterol, and smoking are the main risk factors for heart disease, although other factors also contribute. ?You can take actions to lower your chances of developing heart disease. Work with your health care provider to reduce your risk by following a heart-healthy diet, being physically active, and controlling your weight, blood pressure, and cholesterol level. ?This information is not intended to replace advice given to you by your health care provider. Make sure you discuss any questions you have with your health care provider. ?Document Revised: 01/20/2017 Document Reviewed: 01/20/2017 ?Elsevier Patient Education ? [employer]. ? ?

## 2021-04-11 NOTE — Assessment & Plan Note (Signed)
-  Stable.  ?-Continue current medication regimen. See med list. ?-Will continue to monitor. ?

## 2021-04-11 NOTE — Assessment & Plan Note (Signed)
-  Good sleep hygiene discussed. Recommend to continue melatonin as needed or trial magnesium supplement.  ?

## 2021-04-12 ENCOUNTER — Encounter: Payer: Self-pay | Admitting: Physician Assistant

## 2021-08-24 ENCOUNTER — Other Ambulatory Visit: Payer: Self-pay | Admitting: Physician Assistant

## 2021-08-24 DIAGNOSIS — F4323 Adjustment disorder with mixed anxiety and depressed mood: Secondary | ICD-10-CM

## 2021-09-29 ENCOUNTER — Other Ambulatory Visit: Payer: Self-pay

## 2021-09-29 ENCOUNTER — Telehealth: Payer: Self-pay | Admitting: Nurse Practitioner

## 2021-09-29 DIAGNOSIS — F4323 Adjustment disorder with mixed anxiety and depressed mood: Secondary | ICD-10-CM

## 2021-09-29 MED ORDER — FLUOXETINE HCL 40 MG PO CAPS: 40.0000 mg | ORAL_CAPSULE | Freq: Every day | ORAL | 0 refills | Status: DC

## 2021-09-29 NOTE — Telephone Encounter (Signed)
Patient is wanting to see if she can get a 30-day supply of Prozac.  This medication was just filled and patient is stating she was out of town and left her medication at her daughters house.  Her daughter is going to mail her medication to her.  If so, can you send to [facility] on [address].  Please call patient to let her know if this is possible.

## 2021-09-29 NOTE — Telephone Encounter (Signed)
You rock. Thank you.

## 2021-09-29 NOTE — Telephone Encounter (Signed)
Called pt sent Rx to pharmacy with no refills

## 2021-09-30 ENCOUNTER — Other Ambulatory Visit: Payer: Self-pay | Admitting: Physician Assistant

## 2022-04-09 ENCOUNTER — Ambulatory Visit: Payer: Self-pay | Admitting: Family Medicine

## 2022-05-29 ENCOUNTER — Other Ambulatory Visit: Payer: Self-pay | Admitting: Orthopaedic Surgery

## 2022-05-29 DIAGNOSIS — Z01818 Encounter for other preprocedural examination: Secondary | ICD-10-CM

## 2022-06-01 ENCOUNTER — Other Ambulatory Visit: Payer: Self-pay

## 2022-06-01 ENCOUNTER — Other Ambulatory Visit: Payer: 59

## 2022-06-01 DIAGNOSIS — E782 Mixed hyperlipidemia: Secondary | ICD-10-CM

## 2022-06-01 DIAGNOSIS — Z0000 Encounter for general adult medical examination without abnormal findings: Secondary | ICD-10-CM

## 2022-06-01 DIAGNOSIS — E878 Other disorders of electrolyte and fluid balance, not elsewhere classified: Secondary | ICD-10-CM

## 2022-06-02 LAB — COMPREHENSIVE METABOLIC PANEL
ALT: 24 IU/L (ref 0–32)
AST: 22 IU/L (ref 0–40)
Albumin/Globulin Ratio: 2 (ref 1.2–2.2)
Albumin: 4.1 g/dL (ref 3.9–4.9)
Alkaline Phosphatase: 73 IU/L (ref 44–121)
BUN/Creatinine Ratio: 24 (ref 12–28)
BUN: 18 mg/dL (ref 8–27)
Bilirubin Total: 0.3 mg/dL (ref 0.0–1.2)
CO2: 24 mmol/L (ref 20–29)
Calcium: 9.4 mg/dL (ref 8.7–10.3)
Chloride: 107 mmol/L — ABNORMAL HIGH (ref 96–106)
Creatinine, Ser: 0.74 mg/dL (ref 0.57–1.00)
Globulin, Total: 2.1 g/dL (ref 1.5–4.5)
Glucose: 88 mg/dL (ref 70–99)
Potassium: 5.2 mmol/L (ref 3.5–5.2)
Sodium: 145 mmol/L — ABNORMAL HIGH (ref 134–144)
Total Protein: 6.2 g/dL (ref 6.0–8.5)
eGFR: 91 mL/min/{1.73_m2} (ref >59)

## 2022-06-02 LAB — CBC WITH DIFFERENTIAL/PLATELET
Basophils Absolute: 0 10*3/uL (ref 0.0–0.2)
Basos: 1 %
EOS (ABSOLUTE): 0.1 10*3/uL (ref 0.0–0.4)
Eos: 3 %
Hematocrit: 39.6 % (ref 34.0–46.6)
Hemoglobin: 13 g/dL (ref 11.1–15.9)
Immature Grans (Abs): 0 10*3/uL (ref 0.0–0.1)
Immature Granulocytes: 0 %
Lymphocytes Absolute: 1.4 10*3/uL (ref 0.7–3.1)
Lymphs: 32 %
MCH: 30 pg (ref 26.6–33.0)
MCHC: 32.8 g/dL (ref 31.5–35.7)
MCV: 91 fL (ref 79–97)
Monocytes Absolute: 0.4 10*3/uL (ref 0.1–0.9)
Monocytes: 9 %
Neutrophils Absolute: 2.5 10*3/uL (ref 1.4–7.0)
Neutrophils: 55 %
Platelets: 305 10*3/uL (ref 150–450)
RBC: 4.34 x10E6/uL (ref 3.77–5.28)
RDW: 12.9 % (ref 11.7–15.4)
WBC: 4.5 10*3/uL (ref 3.4–10.8)

## 2022-06-02 LAB — TSH: TSH: 2.92 u[IU]/mL (ref 0.450–4.500)

## 2022-06-02 LAB — LIPID PANEL
Chol/HDL Ratio: 3.6 ratio (ref 0.0–4.4)
Cholesterol, Total: 225 mg/dL — ABNORMAL HIGH (ref 100–199)
HDL: 63 mg/dL (ref >39)
LDL Chol Calc (NIH): 142 mg/dL — ABNORMAL HIGH (ref 0–99)
Triglycerides: 114 mg/dL (ref 0–149)
VLDL Cholesterol Cal: 20 mg/dL (ref 5–40)

## 2022-06-02 LAB — HEMOGLOBIN A1C
Est. average glucose Bld gHb Est-mCnc: 114 mg/dL
Hgb A1c MFr Bld: 5.6 % (ref 4.8–5.6)

## 2022-06-04 ENCOUNTER — Ambulatory Visit: Payer: 59 | Admitting: Family Medicine

## 2022-06-04 ENCOUNTER — Other Ambulatory Visit: Payer: Self-pay

## 2022-06-04 ENCOUNTER — Ambulatory Visit (INDEPENDENT_AMBULATORY_CARE_PROVIDER_SITE_OTHER): Payer: 59 | Admitting: Family Medicine

## 2022-06-04 ENCOUNTER — Encounter: Payer: Self-pay | Admitting: Family Medicine

## 2022-06-04 VITALS — BP 125/81 | HR 67 | Resp 18 | Ht 60.75 in | Wt 134.0 lb

## 2022-06-04 DIAGNOSIS — E782 Mixed hyperlipidemia: Secondary | ICD-10-CM

## 2022-06-04 DIAGNOSIS — Z0000 Encounter for general adult medical examination without abnormal findings: Secondary | ICD-10-CM

## 2022-06-04 DIAGNOSIS — Z01818 Encounter for other preprocedural examination: Secondary | ICD-10-CM

## 2022-06-04 DIAGNOSIS — F3342 Major depressive disorder, recurrent, in full remission: Secondary | ICD-10-CM

## 2022-06-04 MED ORDER — FLUOXETINE HCL 40 MG PO CAPS: 40.0000 mg | ORAL_CAPSULE | Freq: Every day | ORAL | 1 refills | Status: DC

## 2022-06-04 NOTE — Assessment & Plan Note (Signed)
Last lipid panel: LDL 142, HDL 63. The 10-year ASCVD risk score (Arnett DK, et al., 2019) is: 4.3%.  She does admit that she has been eating more and has gained about 10 pounds the last several months as she has been sympathy eating with her husband.  She has also been less active than she typically is because of her ankle injury.  Her rescore is still low enough that making lifestyle changes could be enough to manage hyperlipidemia.  Recommend resuming heart healthy diet low in trans and saturated fats and getting routine physical activity.  Will recheck lipid levels in 6 months.

## 2022-06-04 NOTE — Progress Notes (Signed)
Complete physical exam  Patient: Angelica Knight   DOB: 1960/10/07   62 y.o. Female  MRN: [id number]  Subjective:    Chief Complaint  Patient presents with   Annual Exam   Pre-op Exam    Angelica Knight is a 62 y.o. female who presents today for a complete physical exam. She reports consuming a general diet.  She stays active with yard work and taking care of horses.  She also typically hikes 2 days a week, though she has been out of her typical routine ever since she sprained her ankle in October.  She is planning on getting back to her hiking club when the weather cools down in a few months.  She generally feels well. She reports sleeping well for the most part. She does not have additional problems to discuss today.  The 10-year ASCVD risk score (Arnett DK, et al., 2019) is: 4.3%  Most recent fall risk assessment:    06/04/2022    3:32 PM  Fall Risk   Falls in the past year? 0  Number falls in past yr: 0  Injury with Fall? 0  Risk for fall due to : No Fall Risks  Follow up Falls evaluation completed     Most recent depression and anxiety screenings:    06/04/2022    3:32 PM 04/11/2021    9:39 AM  PHQ 2/9 Scores  PHQ - 2 Score 0 0  PHQ- 9 Score 1 4      06/04/2022    3:32 PM 04/11/2021    9:39 AM 03/24/2019    9:06 AM 02/23/2018   11:29 AM  GAD 7 : Generalized Anxiety Score  Nervous, Anxious, on Edge 0 0 0 0  Control/stop worrying 0 0 0 0  Worry too much - different things 0 0 0 0  Trouble relaxing 0 0 0 0  Restless 0 0 0 0  Easily annoyed or irritable 0 0 0 0  Afraid - awful might happen 0 0 0 0  Total GAD 7 Score 0 0 0 0  Anxiety Difficulty Not difficult at all Not difficult at all      Patient Active Problem List   Diagnosis Date Noted   Primary insomnia 04/11/2021   History of migraine headaches 11/24/2015   Hypertriglyceridemia 11/24/2015   Atypical nevi 11/24/2015   Vitamin D deficiency 09/10/2015   Right bundle branch block 08/29/2015    Nonspecific chest pain 08/29/2015   Mixed hyperlipidemia 08/29/2015   Dyspareunia 05/03/2014   Frequent headaches 05/03/2014   Major depressive disorder 05/03/2014    Past Surgical History:  Procedure Laterality Date   CESAREAN SECTION     COLONOSCOPY  12/17/2009   in [place]- normal exam   TONSILLECTOMY     TUBAL LIGATION     Social History   Tobacco Use   Smoking status: Never    Passive exposure: Never   Smokeless tobacco: Never  Vaping Use   Vaping Use: Never used  Substance Use Topics   Alcohol use: Yes    Alcohol/week: 0.0 standard drinks of alcohol    Comment: occasional   Drug use: No   Family History  Problem Relation Age of Onset   Arthritis Mother    Cancer Mother        Lung   Alzheimer's disease Mother    Cancer Father        prostate   Hypertension Father    Diabetes Father    Hypertension  Sister    Diabetes Sister    Bipolar disorder Sister    Arthritis Sister    Arthritis Maternal Aunt    Colon cancer Neg Hx    Colon polyps Neg Hx    Esophageal cancer Neg Hx    Stomach cancer Neg Hx    Rectal cancer Neg Hx    No Known Allergies   Patient Care Team: Melida Quitter, PA as PCP - General (Family Medicine)   Outpatient Medications Prior to Visit  Medication Sig   Calcium Carbonate-Vitamin D 600-400 MG-UNIT tablet Take at least 2000 vitamin D per day and at least 1200 of calcium per day   COLLAGEN PO Take by mouth.   Multiple Vitamins-Minerals (HAIR/SKIN/NAILS) CAPS Take 1 each by mouth daily. Nutrafol   Omega-3 Fatty Acids (FISH OIL PO) Take by mouth.   [DISCONTINUED] FLUoxetine (PROZAC) 40 MG capsule Take 1 capsule (40 mg total) by mouth daily.   Facility-Administered Medications Prior to Visit  Medication Dose Route Frequency Provider   0.9 %  sodium chloride infusion  500 mL Intravenous Once Danis, Andreas Blower, MD    Review of Systems  Constitutional:  Negative for chills, fever and malaise/fatigue.  HENT:  Negative for  congestion, ear pain and hearing loss.   Eyes:  Negative for blurred vision and double vision.  Respiratory:  Negative for cough and shortness of breath.   Cardiovascular:  Negative for chest pain, palpitations and leg swelling.  Gastrointestinal:  Negative for abdominal pain, constipation, diarrhea and heartburn.  Genitourinary:  Negative for frequency and urgency.  Musculoskeletal:  Negative for myalgias and neck pain.  Neurological:  Negative for headaches.  Endo/Heme/Allergies:  Negative for polydipsia.  Psychiatric/Behavioral:  Negative for depression. The patient is not nervous/anxious and does not have insomnia.      Objective:    BP 125/81 (BP Location: Left Arm, Patient Position: Sitting, Cuff Size: Normal)   Pulse 67   Resp 18   Ht 5' 0.75" (1.543 m)   Wt 134 lb (60.8 kg)   SpO2 97%   BMI 25.53 kg/m    Physical Exam Constitutional:      General: She is not in acute distress.    Appearance: Normal appearance.  HENT:     Head: Normocephalic and atraumatic.     Right Ear: Tympanic membrane, ear canal and external ear normal. There is no impacted cerumen.     Left Ear: Tympanic membrane, ear canal and external ear normal. There is no impacted cerumen.     Nose: Nose normal.     Mouth/Throat:     Mouth: Mucous membranes are moist.     Pharynx: No oropharyngeal exudate or posterior oropharyngeal erythema.  Eyes:     General:        Right eye: No discharge.        Left eye: No discharge.     Extraocular Movements: Extraocular movements intact.     Conjunctiva/sclera: Conjunctivae normal.     Pupils: Pupils are equal, round, and reactive to light.  Neck:     Thyroid: No thyroid mass, thyromegaly or thyroid tenderness.  Cardiovascular:     Rate and Rhythm: Normal rate and regular rhythm.     Heart sounds: Normal heart sounds. No murmur heard.    No friction rub. No gallop.  Pulmonary:     Effort: Pulmonary effort is normal.     Breath sounds: Normal breath sounds.  No wheezing, rhonchi or rales.  Abdominal:  General: Abdomen is flat. Bowel sounds are normal. There is no distension.     Palpations: Abdomen is soft. There is no mass.     Tenderness: There is no abdominal tenderness. There is no guarding.  Musculoskeletal:        General: Normal range of motion.     Cervical back: Normal range of motion and neck supple.     Right lower leg: No edema.     Left lower leg: No edema.  Lymphadenopathy:     Cervical: No cervical adenopathy.  Skin:    General: Skin is warm and dry.  Neurological:     Mental Status: She is alert and oriented to person, place, and time.     Cranial Nerves: No cranial nerve deficit.     Motor: No weakness.     Deep Tendon Reflexes: Reflexes normal.  Psychiatric:        Mood and Affect: Mood normal.       Assessment & Plan:    Routine Health Maintenance and Physical Exam  Immunization History  Administered Date(s) Administered   Hepatitis A, Adult 06/23/2016   PFIZER(Purple Top)SARS-COV-2 Vaccination 04/17/2019, 05/09/2019, 01/17/2020, 06/07/2020   PNEUMOCOCCAL CONJUGATE-20 09/24/2020   Tdap 07/11/2015   Zoster Recombinat (Shingrix) 08/01/2019, 10/26/2019    Health Maintenance  Topic Date Due   COVID-19 Vaccine (5 - 2023-24 season) 09/19/2021   PAP SMEAR-Modifier  05/04/2022   INFLUENZA VACCINE  08/20/2022   MAMMOGRAM  02/21/2023   DTaP/Tdap/Td (2 - Td or Tdap) 07/10/2025   COLONOSCOPY (Pts 45-33yrs Insurance coverage will need to be confirmed)  10/18/2025   Hepatitis C Screening  Completed   HIV Screening  Completed   Zoster Vaccines- Shingrix  Completed   HPV VACCINES  Aged Out   Patient is due for a Pap smear.  She will have this completed at her next appointment.  Discussed health benefits of physical activity, and encouraged her to engage in regular exercise appropriate for her age and condition.  Wellness examination  Mixed hyperlipidemia Assessment & Plan: Last lipid panel: LDL 142, HDL 63.  The 10-year ASCVD risk score (Arnett DK, et al., 2019) is: 4.3%.  She does admit that she has been eating more and has gained about 10 pounds the last several months as she has been sympathy eating with her husband.  She has also been less active than she typically is because of her ankle injury.  Her rescore is still low enough that making lifestyle changes could be enough to manage hyperlipidemia.  Recommend resuming heart healthy diet low in trans and saturated fats and getting routine physical activity.  Will recheck lipid levels in 6 months.   Recurrent major depressive disorder, in full remission (HCC) Assessment & Plan: PHQ-9 score of 1, stable.  Continue Prozac 40 mg daily.  Will continue to monitor.  Orders: -     FLUoxetine HCl; Take 1 capsule (40 mg total) by mouth daily.  Dispense: 90 capsule; Refill: 1  Preoperative clearance  Filled out surgical clearance forms, they will be faxed today.  Return in about 6 months (around 12/05/2022) for follow-up for HTN, HLD, mood, fasting blood work 1 week before.     Melida Quitter, PA

## 2022-06-04 NOTE — Assessment & Plan Note (Signed)
PHQ-9 score of 1, stable.  Continue Prozac 40 mg daily.  Will continue to monitor.

## 2022-06-04 NOTE — Patient Instructions (Signed)
It was nice to meet you today, I hope surgery goes well!

## 2022-06-19 ENCOUNTER — Ambulatory Visit
Admission: RE | Admit: 2022-06-19 | Discharge: 2022-06-19 | Disposition: A | Discharge status: Home / Self Care | Payer: 59 | Source: Ambulatory Visit | Attending: Orthopaedic Surgery | Admitting: Orthopaedic Surgery

## 2022-06-19 DIAGNOSIS — Z01818 Encounter for other preprocedural examination: Secondary | ICD-10-CM

## 2022-11-23 ENCOUNTER — Other Ambulatory Visit: Payer: Self-pay

## 2022-11-23 DIAGNOSIS — F5101 Primary insomnia: Secondary | ICD-10-CM

## 2022-11-23 DIAGNOSIS — E782 Mixed hyperlipidemia: Secondary | ICD-10-CM

## 2022-11-23 DIAGNOSIS — E878 Other disorders of electrolyte and fluid balance, not elsewhere classified: Secondary | ICD-10-CM

## 2022-12-03 ENCOUNTER — Other Ambulatory Visit: Payer: 59

## 2022-12-03 DIAGNOSIS — E782 Mixed hyperlipidemia: Secondary | ICD-10-CM

## 2022-12-03 DIAGNOSIS — F5101 Primary insomnia: Secondary | ICD-10-CM

## 2022-12-03 DIAGNOSIS — E878 Other disorders of electrolyte and fluid balance, not elsewhere classified: Secondary | ICD-10-CM

## 2022-12-04 LAB — HEMOGLOBIN A1C
Est. average glucose Bld gHb Est-mCnc: 111 mg/dL
Hgb A1c MFr Bld: 5.5 % (ref 4.8–5.6)

## 2022-12-04 LAB — COMPREHENSIVE METABOLIC PANEL
ALT: 20 [IU]/L (ref 0–32)
AST: 21 [IU]/L (ref 0–40)
Albumin: 4.6 g/dL (ref 3.9–4.9)
Alkaline Phosphatase: 75 [IU]/L (ref 44–121)
BUN/Creatinine Ratio: 18 (ref 12–28)
BUN: 11 mg/dL (ref 8–27)
Bilirubin Total: 0.3 mg/dL (ref 0.0–1.2)
CO2: 23 mmol/L (ref 20–29)
Calcium: 9.4 mg/dL (ref 8.7–10.3)
Chloride: 102 mmol/L (ref 96–106)
Creatinine, Ser: 0.61 mg/dL (ref 0.57–1.00)
Globulin, Total: 2.1 g/dL (ref 1.5–4.5)
Glucose: 90 mg/dL (ref 70–99)
Potassium: 4.6 mmol/L (ref 3.5–5.2)
Sodium: 141 mmol/L (ref 134–144)
Total Protein: 6.7 g/dL (ref 6.0–8.5)
eGFR: 100 mL/min/{1.73_m2} (ref >59)

## 2022-12-04 LAB — LIPID PANEL
Chol/HDL Ratio: 3.7 ratio (ref 0.0–4.4)
Cholesterol, Total: 227 mg/dL — ABNORMAL HIGH (ref 100–199)
HDL: 62 mg/dL (ref >39)
LDL Chol Calc (NIH): 142 mg/dL — ABNORMAL HIGH (ref 0–99)
Triglycerides: 130 mg/dL (ref 0–149)
VLDL Cholesterol Cal: 23 mg/dL (ref 5–40)

## 2022-12-04 LAB — CBC WITH DIFFERENTIAL/PLATELET
Basophils Absolute: 0.1 10*3/uL (ref 0.0–0.2)
Basos: 1 %
EOS (ABSOLUTE): 0.2 10*3/uL (ref 0.0–0.4)
Eos: 3 %
Hematocrit: 43.4 % (ref 34.0–46.6)
Hemoglobin: 13.8 g/dL (ref 11.1–15.9)
Immature Grans (Abs): 0 10*3/uL (ref 0.0–0.1)
Immature Granulocytes: 0 %
Lymphocytes Absolute: 1.7 10*3/uL (ref 0.7–3.1)
Lymphs: 29 %
MCH: 29.6 pg (ref 26.6–33.0)
MCHC: 31.8 g/dL (ref 31.5–35.7)
MCV: 93 fL (ref 79–97)
Monocytes Absolute: 0.4 10*3/uL (ref 0.1–0.9)
Monocytes: 8 %
Neutrophils Absolute: 3.3 10*3/uL (ref 1.4–7.0)
Neutrophils: 59 %
Platelets: 378 10*3/uL (ref 150–450)
RBC: 4.66 x10E6/uL (ref 3.77–5.28)
RDW: 12.7 % (ref 11.7–15.4)
WBC: 5.6 10*3/uL (ref 3.4–10.8)

## 2022-12-07 ENCOUNTER — Encounter: Payer: Self-pay | Admitting: Family Medicine

## 2022-12-07 ENCOUNTER — Ambulatory Visit (INDEPENDENT_AMBULATORY_CARE_PROVIDER_SITE_OTHER): Payer: 59 | Admitting: Family Medicine

## 2022-12-07 VITALS — BP 129/62 | HR 79 | Resp 18 | Ht 60.75 in | Wt 132.0 lb

## 2022-12-07 DIAGNOSIS — F3342 Major depressive disorder, recurrent, in full remission: Secondary | ICD-10-CM | POA: Diagnosis not present

## 2022-12-07 DIAGNOSIS — R42 Dizziness and giddiness: Secondary | ICD-10-CM | POA: Diagnosis not present

## 2022-12-07 DIAGNOSIS — E782 Mixed hyperlipidemia: Secondary | ICD-10-CM | POA: Diagnosis not present

## 2022-12-07 NOTE — Progress Notes (Signed)
Established Angelica Knight Office Visit  Subjective   Angelica Knight ID: TEMA WAINER, female    DOB: 1960-07-21  Age: 62 y.o. MRN: [id number]  Chief Complaint  Angelica Knight presents with   Anxiety   Depression   Hyperlipidemia   Hypertension    HPI Angelica Knight is a 62 y.o. female presenting today for follow up of  hyperlipidemia, mood.  Angelica Knight also reports episodic dizzy spells, states they happen seldom but are giving her pause.  Angelica Knight started having dizzy spells less than 1 year ago. Hyperlipidemia: At last appointment, discussed routine physical activity and nutritional interventions to help lower cholesterol level and 10-year ASCVD risk score.  The 10-year ASCVD risk score (Arnett DK, et al., 2019) is: 4.7% Mood: Angelica Knight is here to follow up for depression and anxiety, currently managing with Prozac 40 mg daily. Taking medication without side effects, reports excellent compliance with treatment. Denies mood changes or SI/HI. Angelica Knight feels mood is stable since last visit.     12/07/2022    2:16 PM 06/04/2022    3:32 PM 04/11/2021    9:39 AM  Depression screen PHQ 2/9  Decreased Interest 0 0 0  Down, Depressed, Hopeless 0 0 0  PHQ - 2 Score 0 0 0  Altered sleeping 1 1 1   Tired, decreased energy 1 0 1  Change in appetite 0 0 1  Feeling bad or failure about yourself  0 0 0  Trouble concentrating 0 0 1  Moving slowly or fidgety/restless 0 0 0  Suicidal thoughts 0 0 0  PHQ-9 Score 2 1 4   Difficult doing work/chores Not difficult at all Not difficult at all Not difficult at all       12/07/2022    2:16 PM 06/04/2022    3:32 PM 04/11/2021    9:39 AM 03/24/2019    9:06 AM  GAD 7 : Generalized Anxiety Score  Nervous, Anxious, on Edge 0 0 0 0  Control/stop worrying 0 0 0 0  Worry too much - different things 0 0 0 0  Trouble relaxing 0 0 0 0  Restless 0 0 0 0  Easily annoyed or irritable 0 0 0 0  Afraid - awful might happen 0 0 0 0  Total GAD 7 Score 0 0 0 0  Anxiety Difficulty Not  difficult at all Not difficult at all Not difficult at all    Outpatient Medications Prior to Visit  Medication Sig   aspirin 81 MG chewable tablet Chew 81 mg by mouth 2 (two) times daily.   Calcium Carbonate-Vitamin D 600-400 MG-UNIT tablet Take at least 2000 vitamin D per day and at least 1200 of calcium per day   COLLAGEN PO Take by mouth.   FLUoxetine (PROZAC) 40 MG capsule Take 1 capsule (40 mg total) by mouth daily.   Multiple Vitamins-Minerals (HAIR/SKIN/NAILS) CAPS Take 1 each by mouth daily. Nutrafol   Omega-3 Fatty Acids (FISH OIL PO) Take by mouth.   Facility-Administered Medications Prior to Visit  Medication Dose Route Frequency Provider   0.9 %  sodium chloride infusion  500 mL Intravenous Once Charlie Pitter III, MD    ROS Negative unless otherwise noted in HPI   Objective:     BP 129/62 (BP Location: Left Arm, Angelica Knight Position: Sitting, Cuff Size: Normal)   Pulse 79   Resp 18   Ht 5' 0.75" (1.543 m)   Wt 132 lb (59.9 kg)   SpO2 97%   BMI 25.15 kg/m  Physical Exam Constitutional:      General: Angelica Knight is not in acute distress.    Appearance: Normal appearance.  HENT:     Head: Normocephalic and atraumatic.  Cardiovascular:     Rate and Rhythm: Normal rate and regular rhythm.     Heart sounds: No murmur heard.    No friction rub. No gallop.  Pulmonary:     Effort: Pulmonary effort is normal. No respiratory distress.     Breath sounds: No wheezing, rhonchi or rales.  Skin:    General: Skin is warm and dry.  Neurological:     Mental Status: Angelica Knight is alert and oriented to person, place, and time.     Assessment & Plan:  Mixed hyperlipidemia Assessment & Plan: Last lipid panel: LDL 142, HDL 62, triglycerides 130. The 10-year ASCVD risk score (Arnett DK, et al., 2019) is: 4.7%.  Continue limiting trans and saturated fats and getting routine physical activity.  May consider starting statin therapy in the future depending on results of workup for  dizziness.   Recurrent major depressive disorder, in full remission (HCC) Assessment & Plan: Stable.  Continue Prozac 40 mg daily.  Will continue to monitor.   Dizzy spells  Recent CMP, CBC, A1c within normal limits.  Encourage Angelica Knight to keep symptom log for the next 2 months and will reassess at that time after identifying possible triggers, accurate description of symptoms, associated symptoms.  Discussed recommendation guidelines for mammograms, Angelica Knight will update mammogram February 2025.  Return in about 2 months (around 02/06/2023) for follow-up for dizzy spells + pap smear.    Melida Quitter, PA

## 2022-12-07 NOTE — Patient Instructions (Addendum)
Keep a symptom log of your dizzy spells over the next 2 months.  Make note of what it felt like, what you are doing, how long it lasted.  Write down all of these factors and bring the symptom log to your next appointment.  Other than cholesterol, labs looked good.  After I see you again in 2 months, we can reevaluate to see if we need to repeat any labs at that time.

## 2022-12-07 NOTE — Assessment & Plan Note (Signed)
Stable.  Continue Prozac 40 mg daily.  Will continue to monitor.

## 2022-12-07 NOTE — Assessment & Plan Note (Addendum)
Last lipid panel: LDL 142, HDL 62, triglycerides 130. The 10-year ASCVD risk score (Arnett DK, et al., 2019) is: 4.7%.  Continue limiting trans and saturated fats and getting routine physical activity.  May consider starting statin therapy in the future depending on results of workup for dizziness.

## 2023-02-09 ENCOUNTER — Encounter: Payer: Self-pay | Admitting: Family Medicine

## 2023-02-09 ENCOUNTER — Ambulatory Visit (INDEPENDENT_AMBULATORY_CARE_PROVIDER_SITE_OTHER): Payer: 59 | Admitting: Family Medicine

## 2023-02-09 ENCOUNTER — Other Ambulatory Visit (HOSPITAL_COMMUNITY)
Admission: RE | Admit: 2023-02-09 | Discharge: 2023-02-09 | Disposition: A | Discharge status: Home / Self Care | Payer: 59 | Source: Ambulatory Visit | Attending: Family Medicine | Admitting: Family Medicine

## 2023-02-09 VITALS — BP 114/68 | HR 67 | Ht 60.75 in | Wt 132.8 lb

## 2023-02-09 DIAGNOSIS — Z124 Encounter for screening for malignant neoplasm of cervix: Secondary | ICD-10-CM | POA: Diagnosis not present

## 2023-02-09 DIAGNOSIS — F3342 Major depressive disorder, recurrent, in full remission: Secondary | ICD-10-CM

## 2023-02-09 DIAGNOSIS — R42 Dizziness and giddiness: Secondary | ICD-10-CM | POA: Diagnosis not present

## 2023-02-09 MED ORDER — FLUOXETINE HCL 40 MG PO CAPS
40.0000 mg | ORAL_CAPSULE | Freq: Every day | ORAL | 1 refills | Status: AC
Start: 2023-02-09 — End: ?

## 2023-02-09 NOTE — Patient Instructions (Addendum)
Unfortunately, it does not look like there are any [facility] practices in [place].  There are practices connected with the [facility] health system which uses the same electronic medical record system that we do.  Those would be very easy to transfer her health records to!  Your colonoscopy will be due in September 2027.  Health Maintenance  Topic Date Due   Pap with HPV screening  05/04/2022   COVID-19 Vaccine (5 - 2024-25 season) 09/20/2022   Flu Shot  04/19/2023*   Mammogram  02/21/2023   DTaP/Tdap/Td vaccine (2 - Td or Tdap) 07/10/2025   Colon Cancer Screening  10/18/2025   Hepatitis C Screening  Completed   HIV Screening  Completed   Zoster (Shingles) Vaccine  Completed   Pneumococcal Vaccination  Aged Out   HPV Vaccine  Aged Out  *Topic was postponed. The date shown is not the original due date.

## 2023-02-09 NOTE — Assessment & Plan Note (Signed)
Will continue to monitor.  Possibly BPPV, with upcoming move will continue to monitor symptoms for now.  Discussed that referral to ENT would be reasonable next step for evaluation and possible Epley maneuver.  Patient verbalized understanding and is agreeable to this plan.

## 2023-02-09 NOTE — Progress Notes (Signed)
Established Patient Office Visit  Subjective   Patient ID: Angelica Knight, female    DOB: 09-Dec-1960  Age: 63 y.o. MRN: [id number]  Chief Complaint  Patient presents with   Gynecologic Exam    HPI Angelica Knight is a 63 y.o. female presenting today for follow up of dizzy spells.  At last appointment on 02/05/2022, reported episodic dizzy spells happening often enough to give her pause.  Episodes initially started less than 1 year prior.  CMP, CBC, A1c within normal limits.  Patient was encouraged to keep a symptom log for any possible triggers, symptom descriptions, other associated symptoms.  Episodes of dizziness have not become more frequent or more intense.  She has not identified any clear triggers other than going from cold weather outside to warmth inside buildings.  Her blood pressure has remained within normal limits.  Outpatient Medications Prior to Visit  Medication Sig   Melatonin 10 MG TABS Take 10 mg by mouth daily. Take one tablet at bedtime   Calcium Carbonate-Vitamin D 600-400 MG-UNIT tablet Take at least 2000 vitamin D per day and at least 1200 of calcium per day   [DISCONTINUED] aspirin 81 MG chewable tablet Chew 81 mg by mouth 2 (two) times daily.   [DISCONTINUED] COLLAGEN PO Take by mouth.   [DISCONTINUED] FLUoxetine (PROZAC) 40 MG capsule Take 1 capsule (40 mg total) by mouth daily.   [DISCONTINUED] Multiple Vitamins-Minerals (HAIR/SKIN/NAILS) CAPS Take 1 each by mouth daily. Nutrafol   [DISCONTINUED] Omega-3 Fatty Acids (FISH OIL PO) Take by mouth.   Facility-Administered Medications Prior to Visit  Medication Dose Route Frequency Provider   0.9 %  sodium chloride infusion  500 mL Intravenous Once Charlie Pitter III, MD    ROS Negative unless otherwise noted in HPI   Objective:     BP 114/68   Pulse 67   Ht 5' 0.75" (1.543 m)   Wt 132 lb 12 oz (60.2 kg)   SpO2 98%   BMI 25.29 kg/m   Physical Exam Exam conducted with a chaperone present.   Constitutional:      General: She is not in acute distress.    Appearance: Normal appearance.  HENT:     Head: Normocephalic and atraumatic.  Pulmonary:     Effort: Pulmonary effort is normal. No respiratory distress.  Abdominal:     Hernia: There is no hernia in the left inguinal area or right inguinal area.  Genitourinary:    Exam position: Lithotomy position.     Pubic Area: No rash or pubic lice.      Tanner stage (genital): 5.     Labia:        Right: No rash, tenderness, lesion or injury.        Left: No rash, tenderness, lesion or injury.      Urethra: No prolapse or urethral pain.     Vagina: Tenderness (with insertion of speculum) present. No vaginal discharge, erythema or bleeding.     Cervix: No friability, lesion, erythema or cervical bleeding.     Uterus: Normal. Not tender.      Adnexa:        Right: No mass, tenderness or fullness.         Left: No mass, tenderness or fullness.       Rectum: No anal fissure or external hemorrhoid.  Musculoskeletal:     Cervical back: Normal range of motion.  Lymphadenopathy:     Lower Body: No right inguinal adenopathy.  No left inguinal adenopathy.  Neurological:     General: No focal deficit present.     Mental Status: She is alert and oriented to person, place, and time. Mental status is at baseline.  Psychiatric:        Mood and Affect: Mood normal.        Thought Content: Thought content normal.        Judgment: Judgment normal.      Assessment & Plan:  Dizzy spells Assessment & Plan: Will continue to monitor.  Possibly BPPV, with upcoming move will continue to monitor symptoms for now.  Discussed that referral to ENT would be reasonable next step for evaluation and possible Epley maneuver.  Patient verbalized understanding and is agreeable to this plan.   Screening for malignant neoplasm of cervix -     Cytology - PAP  Recurrent major depressive disorder, in full remission (HCC) -     FLUoxetine HCl; Take 1  capsule (40 mg total) by mouth daily.  Dispense: 90 capsule; Refill: 1    Return in about 4 months (around 06/09/2023) for annual physical, fasting labs 1 week before.    Melida Quitter, PA

## 2023-02-15 ENCOUNTER — Encounter: Payer: Self-pay | Admitting: Family Medicine

## 2023-02-15 LAB — CYTOLOGY - PAP
Adequacy: ABNORMAL
Comment: NEGATIVE
High risk HPV: NEGATIVE

## 2023-02-25 ENCOUNTER — Encounter: Payer: Self-pay | Admitting: Family Medicine

## 2023-07-05 ENCOUNTER — Other Ambulatory Visit: Payer: 59

## 2023-07-13 ENCOUNTER — Encounter: Payer: 59 | Admitting: Family Medicine

## 2023-10-11 ENCOUNTER — Encounter: Payer: Self-pay | Admitting: Family Medicine
